# Patient Record
Sex: Female | Born: 1955 | Race: Asian | Hispanic: No | Marital: Married | Smoking: Never smoker
Health system: Southern US, Community
[De-identification: ages and names within clinical notes are randomized; demographics above are authoritative.]

## PROBLEM LIST (undated history)

## (undated) DIAGNOSIS — D649 Anemia, unspecified: Secondary | ICD-10-CM

## (undated) DIAGNOSIS — K219 Gastro-esophageal reflux disease without esophagitis: Secondary | ICD-10-CM

## (undated) DIAGNOSIS — E785 Hyperlipidemia, unspecified: Secondary | ICD-10-CM

## (undated) DIAGNOSIS — T7840XA Allergy, unspecified, initial encounter: Secondary | ICD-10-CM

## (undated) HISTORY — DX: Gastro-esophageal reflux disease without esophagitis: K21.9

## (undated) HISTORY — DX: Anemia, unspecified: D64.9

## (undated) HISTORY — PX: OVARIAN CYST REMOVAL: SHX89

## (undated) HISTORY — PX: ECTOPIC PREGNANCY SURGERY: SHX613

## (undated) HISTORY — DX: Allergy, unspecified, initial encounter: T78.40XA

## (undated) HISTORY — DX: Hyperlipidemia, unspecified: E78.5

## (undated) HISTORY — PX: APPENDECTOMY: SHX54

---

## 2018-04-22 DIAGNOSIS — M2669 Other specified disorders of temporomandibular joint: Secondary | ICD-10-CM | POA: Insufficient documentation

## 2020-03-08 ENCOUNTER — Encounter: Payer: Self-pay | Admitting: Medical-Surgical

## 2020-03-08 ENCOUNTER — Encounter: Payer: Self-pay | Admitting: Gastroenterology

## 2020-03-08 ENCOUNTER — Ambulatory Visit (INDEPENDENT_AMBULATORY_CARE_PROVIDER_SITE_OTHER): Payer: BC Managed Care – PPO | Admitting: Medical-Surgical

## 2020-03-08 VITALS — BP 110/72 | HR 64 | Temp 98.1°F | Ht 65.0 in | Wt 125.6 lb

## 2020-03-08 DIAGNOSIS — Z114 Encounter for screening for human immunodeficiency virus [HIV]: Secondary | ICD-10-CM

## 2020-03-08 DIAGNOSIS — Z0000 Encounter for general adult medical examination without abnormal findings: Secondary | ICD-10-CM | POA: Diagnosis not present

## 2020-03-08 DIAGNOSIS — Z1329 Encounter for screening for other suspected endocrine disorder: Secondary | ICD-10-CM

## 2020-03-08 DIAGNOSIS — Z7689 Persons encountering health services in other specified circumstances: Secondary | ICD-10-CM | POA: Diagnosis not present

## 2020-03-08 DIAGNOSIS — Z13820 Encounter for screening for osteoporosis: Secondary | ICD-10-CM

## 2020-03-08 DIAGNOSIS — Z1211 Encounter for screening for malignant neoplasm of colon: Secondary | ICD-10-CM

## 2020-03-08 DIAGNOSIS — Z1159 Encounter for screening for other viral diseases: Secondary | ICD-10-CM | POA: Diagnosis not present

## 2020-03-08 DIAGNOSIS — Z23 Encounter for immunization: Secondary | ICD-10-CM

## 2020-03-08 DIAGNOSIS — Z1231 Encounter for screening mammogram for malignant neoplasm of breast: Secondary | ICD-10-CM

## 2020-03-08 NOTE — Patient Instructions (Addendum)

## 2020-03-08 NOTE — Progress Notes (Signed)
New Patient Office Visit  Subjective:  Patient ID: Brittany Travis, female    DOB: 1955-07-16  Age: 64 y.o. MRN: [id number]  CC:  Chief Complaint  Patient presents with  . Establish Care    HPI Brittany Travis presents for to establish care and for an annual physical exam.  She has not had a healthcare provider in approximately 3 years and would like to get caught up on all of her health care needs today.   Dentist- not established with a dentist yet, using Invisalign Eye exam- last year, wears glasses Exercise- swimming  Diet- no special diet, allergic to several fresh fruits (apples, pears, peach, plum, berries, almond) that causes itchy throat but no hives/anaphylaxis; can eat baked/cooked fruits  Dizziness- history of vertigo x2.  Notes that she does have dizziness regularly with rapid position changes and or turning quickly.  This resolves very quickly but has begun to occur more rapidly in the recent year.  Not accompanied by nausea or hearing changes.  No falls or syncopal episodes.  History reviewed. No pertinent past medical history.  Past Surgical History:  Procedure Laterality Date  . APPENDECTOMY    . CESAREAN SECTION    . ECTOPIC PREGNANCY SURGERY    . OVARIAN CYST REMOVAL      Family History  Problem Relation Age of Onset  . Hypothyroidism Mother     Social History   Socioeconomic History  . Marital status: Married    Spouse name: Not on file  . Number of children: Not on file  . Years of education: Not on file  . Highest education level: Not on file  Occupational History  . Not on file  Tobacco Use  . Smoking status: Never Smoker  . Smokeless tobacco: Never Used  Vaping Use  . Vaping Use: Former  Substance and Sexual Activity  . Alcohol use: Yes    Comment: Rarely  . Drug use: Never  . Sexual activity: Yes    Partners: Male    Birth control/protection: Post-menopausal  Other Topics Concern  . Not on file  Social History Narrative  . Not on file    Social Determinants of Health   Financial Resource Strain:   . Difficulty of Paying Living Expenses: Not on file  Food Insecurity:   . Worried About Programme researcher, broadcasting/film/video in the Last Year: Not on file  . Ran Out of Food in the Last Year: Not on file  Transportation Needs:   . Lack of Transportation (Medical): Not on file  . Lack of Transportation (Non-Medical): Not on file  Physical Activity:   . Days of Exercise per Week: Not on file  . Minutes of Exercise per Session: Not on file  Stress:   . Feeling of Stress : Not on file  Social Connections:   . Frequency of Communication with Friends and Family: Not on file  . Frequency of Social Gatherings with Friends and Family: Not on file  . Attends Religious Services: Not on file  . Active Member of Clubs or Organizations: Not on file  . Attends Banker Meetings: Not on file  . Marital Status: Not on file  Intimate Partner Violence:   . Fear of Current or Ex-Partner: Not on file  . Emotionally Abused: Not on file  . Physically Abused: Not on file  . Sexually Abused: Not on file    ROS Review of Systems  Constitutional: Positive for unexpected weight change (5lbs; thinks it's related  to difficulty chewing). Negative for chills, fatigue and fever.  HENT: Negative for congestion, hearing loss, rhinorrhea, sinus pressure and sore throat.   Eyes: Negative for visual disturbance.  Respiratory: Negative for cough, chest tightness, shortness of breath and wheezing.   Cardiovascular: Positive for leg swelling (with prolonged sitting or standing). Negative for chest pain and palpitations.  Gastrointestinal: Positive for constipation (intermittently). Negative for abdominal pain, diarrhea, nausea and vomiting.  Endocrine: Negative for cold intolerance, heat intolerance, polydipsia, polyphagia and polyuria.  Genitourinary: Negative for dysuria, frequency and urgency.  Musculoskeletal: Negative for arthralgias, back pain and  myalgias.  Skin: Negative for pallor and rash.  Allergic/Immunologic: Positive for environmental allergies and food allergies.  Neurological: Positive for dizziness and light-headedness. Negative for weakness and headaches.  Hematological: Bruises/bleeds easily.  Psychiatric/Behavioral: Negative for dysphoric mood, self-injury, sleep disturbance and suicidal ideas. The patient is not nervous/anxious.     Objective:   Today's Vitals: BP 110/72   Pulse 64   Temp 98.1 F (36.7 C) (Oral)   Ht 5\' 5"  (1.651 m)   Wt 125 lb 9.6 oz (57 kg)   SpO2 98%   BMI 20.90 kg/m   Physical Exam Constitutional:      General: She is not in acute distress.    Appearance: Normal appearance. She is normal weight. She is not ill-appearing.  HENT:     Head: Normocephalic and atraumatic.     Right Ear: Tympanic membrane and external ear normal. There is no impacted cerumen.     Left Ear: Tympanic membrane, ear canal and external ear normal. There is no impacted cerumen.     Ears:     Comments: Mild right external ear canal erythema without exudate.    Nose: Nose normal.     Mouth/Throat:     Mouth: Mucous membranes are moist.     Pharynx: No oropharyngeal exudate or posterior oropharyngeal erythema.  Eyes:     Extraocular Movements: Extraocular movements intact.     Conjunctiva/sclera: Conjunctivae normal.     Pupils: Pupils are equal, round, and reactive to light.  Neck:     Thyroid: No thyromegaly.     Vascular: No carotid bruit or JVD.     Trachea: Trachea normal.  Cardiovascular:     Rate and Rhythm: Normal rate and regular rhythm.     Pulses: Normal pulses.     Heart sounds: Normal heart sounds. No murmur heard.  No friction rub. No gallop.   Pulmonary:     Effort: Pulmonary effort is normal. No respiratory distress.     Breath sounds: Normal breath sounds. No wheezing.  Abdominal:     General: Bowel sounds are normal. There is no distension.     Palpations: Abdomen is soft.      Tenderness: There is no abdominal tenderness. There is no guarding.  Musculoskeletal:        General: Normal range of motion.     Cervical back: Normal range of motion and neck supple.  Skin:    General: Skin is warm and dry.  Neurological:     Mental Status: She is alert and oriented to person, place, and time.     Cranial Nerves: No cranial nerve deficit.  Psychiatric:        Mood and Affect: Mood normal.        Behavior: Behavior normal.        Thought Content: Thought content normal.        Judgment: Judgment normal.  Assessment & Plan:   1. Encounter to establish care Reviewed available information and discussed health concerns with patient. Overall healthy without chronic conditions. We will complete her physical today with blood work. She is overdue for pap smear but she will schedule that for a different day. Recommend getting a local dentist and eye care.  2. Annual physical exam Checking CBC, CMP, and Lipid panel - CBC - COMPLETE METABOLIC PANEL WITH GFR - Lipid panel  3. Encounter for hepatitis C screening test for low risk patient Discussed screening recommendations with patient.  She is agreeable so we will add this to her blood work today. - Hepatitis C antibody  4. Encounter for screening for HIV Discussed screening recommendations with patient.  She is agreeable so we will add this to her blood work today. - HIV Antibody (routine testing w rflx)  5. Screening for endocrine disorder Family history of hypothyroidism in her mother and recent 5 pound weight loss.  Checking TSH today. - TSH  6. Encounter for screening mammogram for malignant neoplasm of breast Mammogram ordered. - MM 3D SCREEN BREAST BILATERAL; Future  7. Screen for colon cancer Referring to GI for colonoscopy. - Ambulatory referral to Gastroenterology  8. Screening for osteoporosis DEXA scan ordered. - DG Bone Density; Future  No outpatient encounter medications on file as of  03/08/2020.   No facility-administered encounter medications on file as of 03/08/2020.    Follow-up: Return in about 1 year (around 03/08/2021) for annual physical exam with labs. Return for pap smear at your convenience.  Thayer Ohm, DNP, APRN, FNP-BC  [facility]

## 2020-03-13 LAB — LIPID PANEL
Cholesterol: 197 mg/dL (ref <200)
HDL: 59 mg/dL (ref >=50)
LDL Cholesterol (Calc): 114 mg/dL (calc) — ABNORMAL HIGH
Non-HDL Cholesterol (Calc): 138 mg/dL (calc) — ABNORMAL HIGH (ref <130)
Total CHOL/HDL Ratio: 3.3 (calc) (ref <5.0)
Triglycerides: 126 mg/dL (ref <150)

## 2020-03-13 LAB — COMPLETE METABOLIC PANEL WITH GFR
AG Ratio: 1.5 (calc) (ref 1.0–2.5)
ALT: 7 U/L (ref 6–29)
AST: 16 U/L (ref 10–35)
Albumin: 4.3 g/dL (ref 3.6–5.1)
Alkaline phosphatase (APISO): 71 U/L (ref 37–153)
BUN: 21 mg/dL (ref 7–25)
CO2: 32 mmol/L (ref 20–32)
Calcium: 9.6 mg/dL (ref 8.6–10.4)
Chloride: 103 mmol/L (ref 98–110)
Creat: 0.78 mg/dL (ref 0.50–0.99)
GFR, Est African American: 94 mL/min/{1.73_m2} (ref >=60)
GFR, Est Non African American: 81 mL/min/{1.73_m2} (ref >=60)
Globulin: 2.9 g/dL (calc) (ref 1.9–3.7)
Glucose, Bld: 85 mg/dL (ref 65–99)
Potassium: 4.1 mmol/L (ref 3.5–5.3)
Sodium: 142 mmol/L (ref 135–146)
Total Bilirubin: 0.4 mg/dL (ref 0.2–1.2)
Total Protein: 7.2 g/dL (ref 6.1–8.1)

## 2020-03-13 LAB — CBC
HCT: 40.8 % (ref 35.0–45.0)
Hemoglobin: 13.7 g/dL (ref 11.7–15.5)
MCH: 30.9 pg (ref 27.0–33.0)
MCHC: 33.6 g/dL (ref 32.0–36.0)
MCV: 91.9 fL (ref 80.0–100.0)
MPV: 9.6 fL (ref 7.5–12.5)
Platelets: 210 10*3/uL (ref 140–400)
RBC: 4.44 10*6/uL (ref 3.80–5.10)
RDW: 12.7 % (ref 11.0–15.0)
WBC: 4 10*3/uL (ref 3.8–10.8)

## 2020-03-13 LAB — TSH: TSH: 1.05 mIU/L (ref 0.40–4.50)

## 2020-03-13 LAB — HEPATITIS C ANTIBODY
Hepatitis C Ab: NONREACTIVE
SIGNAL TO CUT-OFF: 0.01 (ref <1.00)

## 2020-03-13 LAB — HIV ANTIBODY (ROUTINE TESTING W REFLEX): HIV 1&2 Ab, 4th Generation: NONREACTIVE

## 2020-03-22 NOTE — Progress Notes (Signed)
Subjective:    CC: pap smear  HPI: Pleasant 64 year old female presenting to have completion of her pap smear. Completed her physical a couple of weeks ago. Last pap smear at least 5 years ago. No history of abnormal pap smears. Denies vaginal discharge, itching, burning, dysuria, fever, chills, abdominal pain, and concern for STIs. Sexually active in a monogamous relationship with her husband.  I reviewed the past medical history, family history, social history, surgical history, and allergies today and no changes were needed.  Please see the problem list section below in epic for further details.  Past Medical History: History reviewed. No pertinent past medical history. Past Surgical History: Past Surgical History:  Procedure Laterality Date   APPENDECTOMY     CESAREAN SECTION     ECTOPIC PREGNANCY SURGERY     OVARIAN CYST REMOVAL     Social History: Social History   Socioeconomic History   Marital status: Married    Spouse name: Not on file   Number of children: Not on file   Years of education: Not on file   Highest education level: Not on file  Occupational History   Not on file  Tobacco Use   Smoking status: Never Smoker   Smokeless tobacco: Never Used  Vaping Use   Vaping Use: Former  Substance and Sexual Activity   Alcohol use: Yes    Comment: Rarely   Drug use: Never   Sexual activity: Yes    Partners: Male    Birth control/protection: Post-menopausal  Other Topics Concern   Not on file  Social History Narrative   Not on file   Social Determinants of Health   Financial Resource Strain:    Difficulty of Paying Living Expenses: Not on file  Food Insecurity:    Worried About Programme researcher, broadcasting/film/video in the Last Year: Not on file   [employer] of Food in the Last Year: Not on file  Transportation Needs:    Lack of Transportation (Medical): Not on file   Lack of Transportation (Non-Medical): Not on file  Physical Activity:    Days of  Exercise per Week: Not on file   Minutes of Exercise per Session: Not on file  Stress:    Feeling of Stress : Not on file  Social Connections:    Frequency of Communication with Friends and Family: Not on file   Frequency of Social Gatherings with Friends and Family: Not on file   Attends Religious Services: Not on file   Active Member of Clubs or Organizations: Not on file   Attends Banker Meetings: Not on file   Marital Status: Not on file   Family History: Family History  Problem Relation Age of Onset   Hypothyroidism Mother    Allergies: No Known Allergies Medications: See med rec.  Review of Systems: See HPI for pertinent positives and negatives.  Objective:    General: Well Developed, well nourished, and in no acute distress.  Neuro: Alert and oriented x3.  HEENT: Normocephalic, atraumatic.  Skin: Warm and dry. Cardiac: Regular rate and rhythm, no murmurs rubs or gallops, no lower extremity edema.  Respiratory: Clear to auscultation bilaterally. Not using accessory muscles, speaking in full sentences. Pelvic exam: normal external genitalia, vulva, vagina, cervix, uterus and adnexa, PAP: Pap smear done today, HPV test, exam chaperoned by Ihor Gully, MA.   Impression and Recommendations:    1. Cervical cancer screening Pap smear with HPV co-testing completed today. If negative, no further pap smears  will be indicated unless symptoms develop. - Cytology - PAP  Return if symptoms worsen or fail to improve. ___________________________________________ Thayer Ohm, DNP, APRN, FNP-BC [facility] and [facility]

## 2020-03-23 ENCOUNTER — Ambulatory Visit (INDEPENDENT_AMBULATORY_CARE_PROVIDER_SITE_OTHER): Payer: BC Managed Care – PPO | Admitting: Medical-Surgical

## 2020-03-23 ENCOUNTER — Encounter: Payer: Self-pay | Admitting: Medical-Surgical

## 2020-03-23 ENCOUNTER — Other Ambulatory Visit: Payer: Self-pay

## 2020-03-23 ENCOUNTER — Other Ambulatory Visit (HOSPITAL_COMMUNITY)
Admission: RE | Admit: 2020-03-23 | Discharge: 2020-03-23 | Disposition: A | Discharge status: Home / Self Care | Payer: BC Managed Care – PPO | Source: Ambulatory Visit | Attending: Medical-Surgical | Admitting: Medical-Surgical

## 2020-03-23 VITALS — BP 100/62 | HR 68 | Temp 98.1°F | Ht 65.0 in | Wt 128.5 lb

## 2020-03-23 DIAGNOSIS — Z124 Encounter for screening for malignant neoplasm of cervix: Secondary | ICD-10-CM | POA: Diagnosis not present

## 2020-03-27 LAB — CYTOLOGY - PAP
Comment: NEGATIVE
Diagnosis: NEGATIVE
High risk HPV: NEGATIVE

## 2020-04-25 ENCOUNTER — Other Ambulatory Visit: Payer: Self-pay

## 2020-04-25 ENCOUNTER — Ambulatory Visit (INDEPENDENT_AMBULATORY_CARE_PROVIDER_SITE_OTHER): Payer: BC Managed Care – PPO

## 2020-04-25 DIAGNOSIS — Z13820 Encounter for screening for osteoporosis: Secondary | ICD-10-CM | POA: Diagnosis not present

## 2020-04-25 DIAGNOSIS — Z1231 Encounter for screening mammogram for malignant neoplasm of breast: Secondary | ICD-10-CM | POA: Diagnosis not present

## 2020-04-25 DIAGNOSIS — Z0000 Encounter for general adult medical examination without abnormal findings: Secondary | ICD-10-CM

## 2020-04-26 ENCOUNTER — Ambulatory Visit (AMBULATORY_SURGERY_CENTER): Payer: Self-pay | Admitting: *Deleted

## 2020-04-26 ENCOUNTER — Other Ambulatory Visit: Payer: Self-pay

## 2020-04-26 VITALS — Ht 65.0 in | Wt 126.0 lb

## 2020-04-26 DIAGNOSIS — Z1211 Encounter for screening for malignant neoplasm of colon: Secondary | ICD-10-CM

## 2020-04-26 MED ORDER — SUTAB 1479-225-188 MG PO TABS: 24.0000 | ORAL_TABLET | ORAL | 0 refills | Status: DC

## 2020-04-26 NOTE — Progress Notes (Signed)

## 2020-04-27 ENCOUNTER — Encounter: Payer: Self-pay | Admitting: Gastroenterology

## 2020-05-10 ENCOUNTER — Other Ambulatory Visit: Payer: Self-pay

## 2020-05-10 ENCOUNTER — Ambulatory Visit (AMBULATORY_SURGERY_CENTER): Payer: BC Managed Care – PPO | Admitting: Gastroenterology

## 2020-05-10 ENCOUNTER — Encounter: Payer: Self-pay | Admitting: Gastroenterology

## 2020-05-10 VITALS — BP 104/58 | HR 70 | Temp 98.6°F | Resp 8 | Ht 65.0 in | Wt 126.0 lb

## 2020-05-10 DIAGNOSIS — Z1211 Encounter for screening for malignant neoplasm of colon: Secondary | ICD-10-CM | POA: Diagnosis not present

## 2020-05-10 DIAGNOSIS — K5730 Diverticulosis of large intestine without perforation or abscess without bleeding: Secondary | ICD-10-CM

## 2020-05-10 DIAGNOSIS — K641 Second degree hemorrhoids: Secondary | ICD-10-CM

## 2020-05-10 MED ORDER — SODIUM CHLORIDE 0.9 % IV SOLN: 500.0000 mL | INTRAVENOUS | Status: DC

## 2020-05-10 NOTE — Progress Notes (Signed)
Pt Drowsy. VSS. To PACU, report to RN. No anesthetic complications noted.  

## 2020-05-10 NOTE — Op Note (Signed)
[facility] Patient Name: Brittany MesaRuby Curnow Procedure Date: 05/10/2020 10:09 AM MRN: [id number] Endoscopist: Doristine LocksVito Jacquita Mulhearn , MD Age: 64 Referring MD:  Date of Birth: Nov 19, 1955 Gender: Female Account #: [id number] Procedure:                Colonoscopy Indications:              Screening for colorectal malignant neoplasm (last                            colonoscopy was more than 10 years ago)                           [place] colon[place] 10+ years [place] in [place] and notable                            for hemorrhoids, but otherwise no polyps. She is                            otherwise without GI symptoms. Medicines:                Monitored Anesthesia Care Procedure:                Pre-Anesthesia Assessment:                           - Prior to the procedure, a History and Physical                            was performed, and patient medications and                            allergies were reviewed. The patient's tolerance of                            previous anesthesia was also reviewed. The risks                            and benefits of the procedure and the sedation                            options and risks were discussed with the patient.                            All questions were answered, and informed consent                            was obtained. Prior Anticoagulants: The patient has                            taken no previous anticoagulant or antiplatelet                            agents. ASA Grade Assessment: II - A patient with  mild systemic disease. After reviewing the risks                            and benefits, the patient was deemed in                            satisfactory condition to undergo the procedure.                           After obtaining informed consent, the colonoscope                            was passed under direct vision. Throughout the                            procedure, the patient's blood pressure, pulse,  and                            oxygen saturations were monitored continuously. The                            Colonoscope was introduced through the anus and                            advanced to the the terminal ileum. The colonoscopy                            was performed without difficulty. The patient                            tolerated the procedure well. The quality of the                            bowel preparation was good. The terminal ileum,                            ileocecal valve, appendiceal orifice, and rectum                            were photographed. Scope In: 10:14:23 AM Scope Out: 10:25:27 AM Scope Withdrawal Time: 0 hours 8 minutes 2 seconds  Total Procedure Duration: 0 hours 11 minutes 4 seconds  Findings:                 Hemorrhoids were found on perianal exam.                           A single small-mouthed diverticulum was found in                            the ascending colon.                           The exam was otherwise normal throughout the  remainder of the colon.                           Non-bleeding internal hemorrhoids were found during                            retroflexion. The hemorrhoids were small and Grade                            II (internal hemorrhoids that prolapse but reduce                            spontaneously).                           The terminal ileum appeared normal. Complications:            No immediate complications. Estimated Blood Loss:     Estimated blood loss: none. Impression:               - Hemorrhoids found on perianal exam.                           - Diverticulosis in the ascending colon.                           - Non-bleeding internal hemorrhoids.                           - The examined portion of the ileum was normal.                           - No specimens collected. Recommendation:           - Patient has a contact number available for                             emergencies. The signs and symptoms of potential                            delayed complications were discussed with the                            patient. Return to normal activities tomorrow.                            Written discharge instructions were provided to the                            patient.                           - Resume previous diet.                           - Continue present medications.                           -  Repeat colonoscopy in 10 years for screening                            purposes.                           - Return to GI office PRN.                           - Internal hemorrhoids were noted on this study and                            may be amenable to hemorrhoid band ligation. If you                            are interested in further treatment of these                            hemorrhoids with band ligation, please contact my                            clinic to set up an appointment for evaluation and                            treatment. Doristine Locks, MD 05/10/2020 10:29:49 AM

## 2020-05-10 NOTE — Progress Notes (Signed)
Vs CW I have reviewed the patient's medical history in detail and updated the computerized patient record.   

## 2020-05-10 NOTE — Patient Instructions (Signed)
YOU HAD AN ENDOSCOPIC PROCEDURE TODAY AT THE [facility]:   Refer to the procedure report that was given to you for any specific questions about what was found during the examination.  If the procedure report does not answer your questions, please call your gastroenterologist to clarify.  If you requested that your care partner not be given the details of your procedure findings, then the procedure report has been included in a sealed envelope for you to review at your convenience later.  YOU SHOULD EXPECT: Some feelings of bloating in the abdomen. Passage of more gas than usual.  Walking can help get rid of the air that was put into your GI tract during the procedure and reduce the bloating. If you had a lower endoscopy (such as a colonoscopy or flexible sigmoidoscopy) you may notice spotting of blood in your stool or on the toilet paper. If you underwent a bowel prep for your procedure, you may not have a normal bowel movement for a few days.  Please Note:  You might notice some irritation and congestion in your nose or some drainage.  This is from the oxygen used during your procedure.  There is no need for concern and it should clear up in a day or so.  SYMPTOMS TO REPORT IMMEDIATELY:   Following lower endoscopy (colonoscopy or flexible sigmoidoscopy):  Excessive amounts of blood in the stool  Significant tenderness or worsening of abdominal pains  Swelling of the abdomen that is new, acute  Fever of 100F or higher  For urgent or emergent issues, a gastroenterologist can be reached at any hour by calling [phone]. Do not use MyChart messaging for urgent concerns.    DIET:  We do recommend a small meal at first, but then you may proceed to your regular diet.  Drink plenty of fluids but you should avoid alcoholic beverages for 24 hours.  ACTIVITY:  You should plan to take it easy for the rest of today and you should NOT DRIVE or use heavy machinery until tomorrow (because  of the sedation medicines used during the test).    FOLLOW UP: Our staff will call the number listed on your records 48-72 hours following your procedure to check on you and address any questions or concerns that you may have regarding the information given to you following your procedure. If we do not reach you, we will leave a message.  We will attempt to reach you two times.  During this call, we will ask if you have developed any symptoms of COVID 19. If you develop any symptoms (ie: fever, flu-like symptoms, shortness of breath, cough etc.) before then, please call [phone].  If you test positive for Covid 19 in the 2 weeks post procedure, please call and report this information to us.    If any biopsies were taken you will be contacted by phone or by letter within the next 1-3 weeks.  Please call us at [phone] if you have not heard about the biopsies in 3 weeks.    SIGNATURES/CONFIDENTIALITY: You and/or your care partner have signed paperwork which will be entered into your electronic medical record.  These signatures attest to the fact that that the information above on your After Visit Summary has been reviewed and is understood.  Full responsibility of the confidentiality of this discharge information lies with you and/or your care-partner. 

## 2020-05-14 ENCOUNTER — Telehealth: Payer: Self-pay | Admitting: *Deleted

## 2020-05-14 NOTE — Telephone Encounter (Signed)
Attempted 2nd f/u phone call. No answer. Left message.  °

## 2020-05-14 NOTE — Telephone Encounter (Signed)
Attempted f/u phone call. No answer. Left message. °

## 2020-06-26 ENCOUNTER — Encounter: Payer: BC Managed Care – PPO | Admitting: Gastroenterology

## 2021-03-22 ENCOUNTER — Encounter: Payer: Self-pay | Admitting: Medical-Surgical

## 2021-03-22 ENCOUNTER — Ambulatory Visit (INDEPENDENT_AMBULATORY_CARE_PROVIDER_SITE_OTHER): Payer: BC Managed Care – PPO | Admitting: Medical-Surgical

## 2021-03-22 ENCOUNTER — Other Ambulatory Visit: Payer: Self-pay

## 2021-03-22 VITALS — BP 101/65 | HR 64 | Resp 20 | Ht 65.0 in | Wt 128.3 lb

## 2021-03-22 DIAGNOSIS — Z0000 Encounter for general adult medical examination without abnormal findings: Secondary | ICD-10-CM | POA: Diagnosis not present

## 2021-03-22 DIAGNOSIS — L299 Pruritus, unspecified: Secondary | ICD-10-CM

## 2021-03-22 DIAGNOSIS — Z23 Encounter for immunization: Secondary | ICD-10-CM

## 2021-03-22 DIAGNOSIS — Z1231 Encounter for screening mammogram for malignant neoplasm of breast: Secondary | ICD-10-CM

## 2021-03-22 LAB — CBC WITH DIFFERENTIAL/PLATELET
Absolute Monocytes: 193 cells/uL — ABNORMAL LOW (ref 200–950)
Basophils Absolute: 19 cells/uL (ref 0–200)
Basophils Relative: 0.4 %
Eosinophils Absolute: 160 cells/uL (ref 15–500)
Eosinophils Relative: 3.4 %
HCT: 42.8 % (ref 35.0–45.0)
Hemoglobin: 14.1 g/dL (ref 11.7–15.5)
Lymphs Abs: 1711 cells/uL (ref 850–3900)
MCH: 30.1 pg (ref 27.0–33.0)
MCHC: 32.9 g/dL (ref 32.0–36.0)
MCV: 91.5 fL (ref 80.0–100.0)
MPV: 9.7 fL (ref 7.5–12.5)
Monocytes Relative: 4.1 %
Neutro Abs: 2618 cells/uL (ref 1500–7800)
Neutrophils Relative %: 55.7 %
Platelets: 205 10*3/uL (ref 140–400)
RBC: 4.68 10*6/uL (ref 3.80–5.10)
RDW: 13 % (ref 11.0–15.0)
Total Lymphocyte: 36.4 %
WBC: 4.7 10*3/uL (ref 3.8–10.8)

## 2021-03-22 LAB — LIPID PANEL
Cholesterol: 215 mg/dL — ABNORMAL HIGH (ref <200)
HDL: 63 mg/dL (ref >=50)
LDL Cholesterol (Calc): 126 mg/dL (calc) — ABNORMAL HIGH
Non-HDL Cholesterol (Calc): 152 mg/dL (calc) — ABNORMAL HIGH (ref <130)
Total CHOL/HDL Ratio: 3.4 (calc) (ref <5.0)
Triglycerides: 143 mg/dL (ref <150)

## 2021-03-22 LAB — COMPLETE METABOLIC PANEL WITH GFR
AG Ratio: 1.6 (calc) (ref 1.0–2.5)
ALT: 11 U/L (ref 6–29)
AST: 19 U/L (ref 10–35)
Albumin: 4.5 g/dL (ref 3.6–5.1)
Alkaline phosphatase (APISO): 84 U/L (ref 37–153)
BUN: 17 mg/dL (ref 7–25)
CO2: 32 mmol/L (ref 20–32)
Calcium: 9.6 mg/dL (ref 8.6–10.4)
Chloride: 102 mmol/L (ref 98–110)
Creat: 0.81 mg/dL (ref 0.50–1.05)
Globulin: 2.8 g/dL (calc) (ref 1.9–3.7)
Glucose, Bld: 89 mg/dL (ref 65–99)
Potassium: 4.4 mmol/L (ref 3.5–5.3)
Sodium: 141 mmol/L (ref 135–146)
Total Bilirubin: 0.7 mg/dL (ref 0.2–1.2)
Total Protein: 7.3 g/dL (ref 6.1–8.1)
eGFR: 81 mL/min/{1.73_m2} (ref >=60)

## 2021-03-22 MED ORDER — HYDROCORTISONE-ACETIC ACID 1-2 % OT SOLN: 3.0000 [drp] | Freq: Two times a day (BID) | OTIC | 0 refills | Status: DC | PRN

## 2021-03-22 NOTE — Patient Instructions (Signed)
Preventive Care 40-64 Years Old, Female Preventive care refers to lifestyle choices and visits with your health care provider that can promote health and wellness. This includes: A yearly physical exam. This is also called an annual wellness visit. Regular dental and eye exams. Immunizations. Screening for certain conditions. Healthy lifestyle choices, such as: Eating a healthy diet. Getting regular exercise. Not using drugs or products that contain nicotine and tobacco. Limiting alcohol use. What can I expect for my preventive care visit? Physical exam Your health care provider will check your: Height and weight. These may be used to calculate your BMI (body mass index). BMI is a measurement that tells if you are at a healthy weight. Heart rate and blood pressure. Body temperature. Skin for abnormal spots. Counseling Your health care provider may ask you questions about your: Past medical problems. Family's medical history. Alcohol, tobacco, and drug use. Emotional well-being. Home life and relationship well-being. Sexual activity. Diet, exercise, and sleep habits. Work and work environment. Access to firearms. Method of birth control. Menstrual cycle. Pregnancy history. What immunizations do I need? Vaccines are usually given at various ages, according to a schedule. Your health care provider will recommend vaccines for you based on your age, medical history, and lifestyle or other factors, such as travel or where you work. What tests do I need? Blood tests Lipid and cholesterol levels. These may be checked every 5 years, or more often if you are over 50 years old. Hepatitis C test. Hepatitis B test. Screening Lung cancer screening. You may have this screening every year starting at age 55 if you have a 30-pack-year history of smoking and currently smoke or have quit within the past 15 years. Colorectal cancer screening. All adults should have this screening starting at  age 50 and continuing until age 75. Your health care provider may recommend screening at age 45 if you are at increased risk. You will have tests every 1-10 years, depending on your results and the type of screening test. Diabetes screening. This is done by checking your blood sugar (glucose) after you have not eaten for a while (fasting). You may have this done every 1-3 years. Mammogram. This may be done every 1-2 years. Talk with your health care provider about when you should start having regular mammograms. This may depend on whether you have a family history of breast cancer. BRCA-related cancer screening. This may be done if you have a family history of breast, ovarian, tubal, or peritoneal cancers. Pelvic exam and Pap test. This may be done every 3 years starting at age 21. Starting at age 30, this may be done every 5 years if you have a Pap test in combination with an HPV test. Other tests STD (sexually transmitted disease) testing, if you are at risk. Bone density scan. This is done to screen for osteoporosis. You may have this scan if you are at high risk for osteoporosis. Talk with your health care provider about your test results, treatment options, and if necessary, the need for more tests. Follow these instructions at home: Eating and drinking  Eat a diet that includes fresh fruits and vegetables, whole grains, lean protein, and low-fat dairy products. Take vitamin and mineral supplements as recommended by your health care provider. Do not drink alcohol if: Your health care provider tells you not to drink. You are pregnant, may be pregnant, or are planning to become pregnant. If you drink alcohol: Limit how much you have to 0-1 drink a day. Be   aware of how much alcohol is in your drink. In the [place] one drink equals one 12 oz bottle of beer (355 mL), one 5 oz glass of wine (148 mL), or one 1 oz glass of hard liquor (44 mL). Lifestyle Take daily care of your teeth and  gums. Brush your teeth every morning and night with fluoride toothpaste. Floss one time each day. Stay active. Exercise for at least 30 minutes 5 or more days each week. Do not use any products that contain nicotine or tobacco, such as cigarettes, e-cigarettes, and chewing tobacco. If you need help quitting, ask your health care provider. Do not use drugs. If you are sexually active, practice safe sex. Use a condom or other form of protection to prevent STIs (sexually transmitted infections). If you do not wish to become pregnant, use a form of birth control. If you plan to become pregnant, see your health care provider for a prepregnancy visit. If told by your health care provider, take low-dose aspirin daily starting at age 63. Find healthy ways to cope with stress, such as: Meditation, yoga, or listening to music. Journaling. Talking to a trusted person. Spending time with friends and family. Safety Always wear your seat belt while driving or riding in a vehicle. Do not drive: If you have been drinking alcohol. Do not ride with someone who has been drinking. When you are tired or distracted. While texting. Wear a helmet and other protective equipment during sports activities. If you have firearms in your house, make sure you follow all gun safety procedures. What's next? Visit your health care provider once a year for an annual wellness visit. Ask your health care provider how often you should have your eyes and teeth checked. Stay up to date on all vaccines. This information is not intended to replace advice given to you by your health care provider. Make sure you discuss any questions you have with your health care provider. Document Revised: 08/31/2020 Document Reviewed: 03/04/2018 Elsevier Patient Education  2022 [employer].

## 2021-03-22 NOTE — Progress Notes (Signed)
HPI: Brittany Travis is a 65 y.o. female who  has a past medical history of Allergy, Anemia, GERD (gastroesophageal reflux disease), and Hyperlipidemia.  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 04/22/2018    Past Surgical History:  Procedure Laterality Date   APPENDECTOMY     CESAREAN SECTION     ECTOPIC PREGNANCY SURGERY     OVARIAN CYST REMOVAL      Social History   Tobacco Use   Smoking status: Never   Smokeless tobacco: Never  Substance Use Topics   Alcohol use: Yes    Comment: Rarely    Family History  Problem Relation Age of Onset   Hypothyroidism Mother    Colon cancer Neg Hx    Colon polyps Neg Hx    Esophageal cancer Neg Hx    Rectal cancer Neg Hx    Stomach cancer Neg Hx      Current medication list and allergy/intolerance information reviewed:    Current Outpatient Medications  Medication Sig Dispense Refill   Multiple Vitamin (MULTIVITAMIN) tablet Take 1 tablet by mouth daily.     No current facility-administered medications for this visit.    No Known Allergies    Review of Systems: Constitutional:  No  fever, no chills, No recent illness, No unintentional weight changes. No significant fatigue.  HEENT: No  headache, no vision change, no hearing change, No sore throat, No  sinus pressure, + mild intermittent ear itching Cardiac: No  chest pain, No  pressure, No palpitations, No  Orthopnea Respiratory:  No  shortness of breath. No  Cough Gastrointestinal: No  abdominal pain, No  nausea, No  vomiting,  No  blood in stool, No   diarrhea, No  constipation  Musculoskeletal: No new myalgia/arthralgia Skin: No  Rash, No other wounds/concerning lesions Genitourinary: No  incontinence, No  abnormal genital bleeding, No abnormal genital discharge Hem/Onc: No  easy bruising/bleeding, No  abnormal lymph node Endocrine: No cold intolerance,  No heat intolerance. No polyuria/polydipsia/polyphagia  Neurologic: No  weakness, No  dizziness, No  slurred speech/focal weakness/facial droop Psychiatric: No  concerns with depression, No  concerns with anxiety, No sleep problems, No mood problems  Exam:  BP 101/65 (BP Location: Left Arm, Patient Position: Sitting, Cuff Size: Small)   Pulse 64   Resp 20   Ht $R'5\' 5"'Nn$  (1.651 m)   Wt 128 lb 4.8 oz (58.2 kg)   SpO2 95%   BMI 21.35 kg/m  Constitutional: VS see above. General Appearance: alert, well-developed, well-nourished, NAD Eyes: Normal lids and conjunctive, non-icteric sclera Ears, Nose, Mouth, Throat: MMM, Normal external inspection ears/nares/mouth/lips/gums. TM normal bilaterally.   Neck: No masses, trachea midline. No thyroid enlargement. No tenderness/mass appreciated. No lymphadenopathy Respiratory: Normal respiratory effort. no wheeze, no rhonchi, no rales Cardiovascular: S1/S2 normal, no murmur, no rub/gallop auscultated. RRR. No lower extremity edema. No carotid bruit or JVD. No abdominal aortic bruit. Gastrointestinal: Nontender, no masses. No hepatomegaly, no splenomegaly. No hernia appreciated. Bowel sounds normal. Rectal exam deferred.  Musculoskeletal: Gait normal. No clubbing/cyanosis of digits.  Neurological: Normal balance/coordination. No tremor. No cranial nerve  deficit on limited exam. Motor and sensation intact and symmetric. Cerebellar reflexes intact.  Skin: warm, dry, intact. No rash/ulcer. No concerning nevi or subq nodules on limited exam.   Psychiatric: Normal judgment/insight. Normal mood and affect. Oriented x3.    ASSESSMENT/PLAN:   1. Annual  physical exam Checking labs. Preventative care information provided with AVS - CBC with Differential/Platelet - COMPLETE METABOLIC PANEL WITH GFR - Lipid panel  2. Encounter for screening mammogram for malignant neoplasm of breast Mammogram ordered.  This will be scheduled at the end of October. - MM 3D SCREEN BREAST BILATERAL; Future  3. Need for shingles vaccine Shingles vaccine #1 given today.  Next vaccine will be due in 2 to 6 months as a nurse visit. - Varicella-zoster vaccine IM (Shingrix)  4.  External ear itching Suspect this is related to allergies versus some mild eczema.  Sending in hydrocortisone eardrops to use as needed to see if this helps.  Orders Placed This Encounter  Procedures   MM 3D SCREEN BREAST BILATERAL   Varicella-zoster vaccine IM (Shingrix)   CBC with Differential/Platelet   COMPLETE METABOLIC PANEL WITH GFR   Lipid panel    No orders of the defined types were placed in this encounter.   Patient Instructions  Preventive Care 79-73 Years Old, Female Preventive care refers to lifestyle choices and visits with your health care provider that can promote health and wellness. This includes: A yearly physical exam. This is also called an annual wellness visit. Regular dental and eye exams. Immunizations. Screening for certain conditions. Healthy lifestyle choices, such as: Eating a healthy diet. Getting regular exercise. Not using drugs or products that contain nicotine and tobacco. Limiting alcohol use. What can I expect for my preventive care visit? Physical exam Your health care provider will check your: Height and weight. These may be used to calculate your BMI (body mass index). BMI is a measurement that tells if you are at a healthy weight. Heart rate and blood pressure. Body temperature. Skin for abnormal spots. Counseling Your health care provider may ask you questions about your: Past medical problems. Family's medical  history. Alcohol, tobacco, and drug use. Emotional well-being. Home life and relationship well-being. Sexual activity. Diet, exercise, and sleep habits. Work and work Statistician. Access to firearms. Method of birth control. Menstrual cycle. Pregnancy history. What immunizations do I need? Vaccines are usually given at various ages, according to a schedule. Your health care provider will recommend vaccines for you based on your age, medical history, and lifestyle or other factors, such as travel or where you work. What tests do I need? Blood tests Lipid and cholesterol levels. These may be checked every 5 years, or more often if you are over 55 years old. Hepatitis C test. Hepatitis B test. Screening Lung cancer screening. You may have this screening every year starting at age 68 if you have a 30-pack-year history of smoking and currently smoke or have quit within the past 15 years. Colorectal cancer screening. All adults should have this screening starting at age 81 and continuing until age 80. Your health care provider may recommend screening at age 71 if you are at increased risk. You will have tests every 1-10 years, depending on your results and the type of screening test. Diabetes screening. This is done by checking your blood sugar (glucose) after you have not eaten for a while (fasting). You may have this done every 1-3 years. Mammogram. This may be done every 1-2 years. Talk with  your health care provider about when you should start having regular mammograms. This may depend on whether you have a family history of breast cancer. BRCA-related cancer screening. This may be done if you have a family history of breast, ovarian, tubal, or peritoneal cancers. Pelvic exam and Pap test. This may be done every 3 years starting at age 64. Starting at age 22, this may be done every 5 years if you have a Pap test in combination with an HPV test. Other tests STD (sexually transmitted  disease) testing, if you are at risk. Bone density scan. This is done to screen for osteoporosis. You may have this scan if you are at high risk for osteoporosis. Talk with your health care provider about your test results, treatment options, and if necessary, the need for more tests. Follow these instructions at home: Eating and drinking  Eat a diet that includes fresh fruits and vegetables, whole grains, lean protein, and low-fat dairy products. Take vitamin and mineral supplements as recommended by your health care provider. Do not drink alcohol if: Your health care provider tells you not to drink. You are pregnant, may be pregnant, or are planning to become pregnant. If you drink alcohol: Limit how much you have to 0-1 drink a day. Be aware of how much alcohol is in your drink. In the [place] one drink equals one 12 oz bottle of beer (355 mL), one 5 oz glass of wine (148 mL), or one 1 oz glass of hard liquor (44 mL). Lifestyle Take daily care of your teeth and gums. Brush your teeth every morning and night with fluoride toothpaste. Floss one time each day. Stay active. Exercise for at least 30 minutes 5 or more days each week. Do not use any products that contain nicotine or tobacco, such as cigarettes, e-cigarettes, and chewing tobacco. If you need help quitting, ask your health care provider. Do not use drugs. If you are sexually active, practice safe sex. Use a condom or other form of protection to prevent STIs (sexually transmitted infections). If you do not wish to become pregnant, use a form of birth control. If you plan to become pregnant, see your health care provider for a prepregnancy visit. If told by your health care provider, take low-dose aspirin daily starting at age 69. Find healthy ways to cope with stress, such as: Meditation, yoga, or listening to music. Journaling. Talking to a trusted person. Spending time with friends and family. Safety Always wear your seat belt  while driving or riding in a vehicle. Do not drive: If you have been drinking alcohol. Do not ride with someone who has been drinking. When you are tired or distracted. While texting. Wear a helmet and other protective equipment during sports activities. If you have firearms in your house, make sure you follow all gun safety procedures. What's next? Visit your health care provider once a year for an annual wellness visit. Ask your health care provider how often you should have your eyes and teeth checked. Stay up to date on all vaccines. This information is not intended to replace advice given to you by your health care provider. Make sure you discuss any questions you have with your health care provider. Document Revised: 08/31/2020 Document Reviewed: 03/04/2018 Elsevier Patient Education  [place].  Follow-up plan: Return in about 1 year (around 03/22/2022) for annual physical exam; 2-6 months for NV for second Shingrix.  Clearnce Sorrel, DNP, APRN, FNP-BC [facility]

## 2021-05-01 ENCOUNTER — Ambulatory Visit: Payer: BC Managed Care – PPO

## 2021-05-15 ENCOUNTER — Ambulatory Visit (INDEPENDENT_AMBULATORY_CARE_PROVIDER_SITE_OTHER): Payer: BC Managed Care – PPO

## 2021-05-15 ENCOUNTER — Other Ambulatory Visit: Payer: Self-pay

## 2021-05-15 DIAGNOSIS — Z1231 Encounter for screening mammogram for malignant neoplasm of breast: Secondary | ICD-10-CM | POA: Diagnosis not present

## 2021-08-21 ENCOUNTER — Ambulatory Visit (INDEPENDENT_AMBULATORY_CARE_PROVIDER_SITE_OTHER): Payer: BC Managed Care – PPO | Admitting: Medical-Surgical

## 2021-08-21 ENCOUNTER — Other Ambulatory Visit: Payer: Self-pay

## 2021-08-21 VITALS — Temp 98.0°F

## 2021-08-21 DIAGNOSIS — Z23 Encounter for immunization: Secondary | ICD-10-CM

## 2021-08-21 NOTE — Progress Notes (Signed)
Patient is here for a shingles vaccine. Denies chest pains, SOB, palpitations, mood or medication changes.   Patient tolerated injection well without complications on the Left deltoid. Patient advised to schedule next appointment as requested from provider.

## 2021-08-21 NOTE — Progress Notes (Signed)
Agree with documentation as above.  ? ?___________________________________________ ?Syrina Wake L. Ayleah Hofmeister, DNP, APRN, FNP-BC ?Primary Care and Sports Medicine ?[facility] ? ?

## 2021-09-18 ENCOUNTER — Encounter: Payer: Self-pay | Admitting: Medical-Surgical

## 2021-09-21 ENCOUNTER — Encounter: Payer: Self-pay | Admitting: Medical-Surgical

## 2021-09-23 ENCOUNTER — Emergency Department (HOSPITAL_BASED_OUTPATIENT_CLINIC_OR_DEPARTMENT_OTHER): Payer: BC Managed Care – PPO

## 2021-09-23 ENCOUNTER — Observation Stay (HOSPITAL_BASED_OUTPATIENT_CLINIC_OR_DEPARTMENT_OTHER)
Admission: EM | Admit: 2021-09-23 | Discharge: 2021-09-25 | Disposition: A | Discharge status: Home / Self Care | Payer: BC Managed Care – PPO | Attending: Internal Medicine | Admitting: Internal Medicine

## 2021-09-23 ENCOUNTER — Telehealth: Payer: Self-pay | Admitting: General Practice

## 2021-09-23 ENCOUNTER — Other Ambulatory Visit: Payer: Self-pay

## 2021-09-23 ENCOUNTER — Encounter (HOSPITAL_BASED_OUTPATIENT_CLINIC_OR_DEPARTMENT_OTHER): Payer: Self-pay | Admitting: Emergency Medicine

## 2021-09-23 DIAGNOSIS — R079 Chest pain, unspecified: Secondary | ICD-10-CM | POA: Diagnosis present

## 2021-09-23 DIAGNOSIS — R0789 Other chest pain: Principal | ICD-10-CM | POA: Insufficient documentation

## 2021-09-23 DIAGNOSIS — R0602 Shortness of breath: Secondary | ICD-10-CM | POA: Diagnosis not present

## 2021-09-23 DIAGNOSIS — Z20822 Contact with and (suspected) exposure to covid-19: Secondary | ICD-10-CM | POA: Insufficient documentation

## 2021-09-23 DIAGNOSIS — I209 Angina pectoris, unspecified: Secondary | ICD-10-CM | POA: Diagnosis present

## 2021-09-23 LAB — CBC
HCT: 39.7 % (ref 36.0–46.0)
Hemoglobin: 13.9 g/dL (ref 12.0–15.0)
MCH: 31.2 pg (ref 26.0–34.0)
MCHC: 35 g/dL (ref 30.0–36.0)
MCV: 89 fL (ref 80.0–100.0)
Platelets: 181 10*3/uL (ref 150–400)
RBC: 4.46 MIL/uL (ref 3.87–5.11)
RDW: 12.4 % (ref 11.5–15.5)
WBC: 4.2 10*3/uL (ref 4.0–10.5)
nRBC: 0 % (ref 0.0–0.2)

## 2021-09-23 LAB — BASIC METABOLIC PANEL
Anion gap: 9 (ref 5–15)
BUN: 16 mg/dL (ref 8–23)
CO2: 27 mmol/L (ref 22–32)
Calcium: 9.4 mg/dL (ref 8.9–10.3)
Chloride: 102 mmol/L (ref 98–111)
Creatinine, Ser: 0.81 mg/dL (ref 0.44–1.00)
GFR, Estimated: >60 mL/min (ref >60)
Glucose, Bld: 96 mg/dL (ref 70–99)
Potassium: 3.6 mmol/L (ref 3.5–5.1)
Sodium: 138 mmol/L (ref 135–145)

## 2021-09-23 LAB — RESP PANEL BY RT-PCR (FLU A&B, COVID) ARPGX2
Influenza A by PCR: NEGATIVE
Influenza B by PCR: NEGATIVE
SARS Coronavirus 2 by RT PCR: NEGATIVE

## 2021-09-23 LAB — BRAIN NATRIURETIC PEPTIDE: B Natriuretic Peptide: 16.6 pg/mL (ref 0.0–100.0)

## 2021-09-23 LAB — D-DIMER, QUANTITATIVE: D-Dimer, Quant: 0.27 ug/mL-FEU (ref 0.00–0.50)

## 2021-09-23 LAB — TROPONIN I (HIGH SENSITIVITY)
Troponin I (High Sensitivity): 4 ng/L (ref <18)
Troponin I (High Sensitivity): 4 ng/L (ref <18)

## 2021-09-23 MED ORDER — MORPHINE SULFATE (PF) 2 MG/ML IV SOLN
2.0000 mg | Freq: Once | INTRAVENOUS | Status: AC
  Administered 2021-09-23: 2 mg via INTRAVENOUS
  Filled 2021-09-23: qty 1

## 2021-09-23 MED ORDER — ACETAMINOPHEN 325 MG PO TABS
650.0000 mg | ORAL_TABLET | Freq: Four times a day (QID) | ORAL | Status: DC | PRN
  Filled 2021-09-23: qty 2

## 2021-09-23 MED ORDER — ASPIRIN 81 MG PO CHEW
324.0000 mg | CHEWABLE_TABLET | Freq: Once | ORAL | Status: AC
  Administered 2021-09-23: 324 mg via ORAL
  Filled 2021-09-23: qty 4

## 2021-09-23 MED ORDER — ONDANSETRON HCL 4 MG/2ML IJ SOLN: 4.0000 mg | Freq: Four times a day (QID) | INTRAMUSCULAR | Status: DC | PRN

## 2021-09-23 MED ORDER — MELATONIN 5 MG PO TABS: 10.0000 mg | ORAL_TABLET | Freq: Every evening | ORAL | Status: DC | PRN

## 2021-09-23 MED ORDER — ACETAMINOPHEN 650 MG RE SUPP: 650.0000 mg | Freq: Four times a day (QID) | RECTAL | Status: DC | PRN

## 2021-09-23 MED ORDER — ONDANSETRON HCL 4 MG PO TABS: 4.0000 mg | ORAL_TABLET | Freq: Four times a day (QID) | ORAL | Status: DC | PRN

## 2021-09-23 NOTE — H&P (Signed)
?History and Physical  ? ? Hausner Gatchalian WGY:[id number] DOB: Feb 10, 1956 DOA: 09/23/2021 ? ?DOS: the patient was seen and examined on 09/23/2021 ? ?PCP: Christen Butter, NP  ? ?Patient coming from: Home ? ?I have personally briefly reviewed patient's old medical records in [facility] ? ?CC: chest pain ?HPI: ?66 yo [place] female without significant medical hx, takes no Rx meds, presents to ED with chest pressure. Pt states on Friday night, she ate fried chicken for dinner, which is a heavy meal for her. She went to bed. Was lying on her left side. She felt chest pressure and palpitations. Last for about 1 hour. Called EMS and taken to [place] [facility]. EKG NSR. 2 set of troponins negative. Pt discharged to home. ? ?On Saturday morning, she slept in late due to late night ER visit. After eating some breakfast, she worked in the yard all day doing Aeronautical engineer. No chest pain or pressure with yard work.  Of Note, pt swam 20 laps in the pool on Thursday. ? ?Went to bed normally on Saturday night. No issues overnight. ? ?Sunday AM, no issues. She felt a little light headed but resolved after drinking some water and milk.  She worked outside in yard again. Maybe a little harder than Saturday. But still no chest pressure or pain with yard work. ? ?Micah Flesher to work today, but has still been thinking about her chest pressure on Friday night.  Pt admits she has been a little nervous about it. ? 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 ? ?Co-worker brought her to [facility].  ? ?Workup in ER negative again with 2 sets negative troponin.  However, while in ER, she developed chest pressure again. Pt sent to [facility] for evaluation. ? ?Pt states her dtr has told her that she stops breathing at night while she is sleeping. Pt have never been evaluated for sleep apnea. Only gets heartburn on a rare occasion. ? ?She swims once a week at the local pool. Able to swim  20 laps without SOB or CP. ? ?No FMHX of CAD, or MI. ? ?Takes no Rx meds or daily Asa.  ? ?ED Course: EKG NSR. 2 sets of troponin negative. Had repeat chest pressure in ED. Sent to [facility]. ? ?Review of Systems:  ?Review of Systems  ?Constitutional: Negative.   ?HENT: Negative.    ?Eyes: Negative.   ?Respiratory: Negative.  Negative for cough, hemoptysis, sputum production, shortness of breath and wheezing.   ?Cardiovascular:  Positive for palpitations. Negative for orthopnea, leg swelling and PND.  ?Gastrointestinal: Negative.   ?Genitourinary: Negative.   ?Musculoskeletal: Negative.   ?Skin: Negative.   ?Neurological:  Positive for tingling.  ?Endo/Heme/Allergies: Negative.   ?Psychiatric/Behavioral: Negative.    ?All other systems reviewed and are negative. ? ?Past Medical History:  ?Diagnosis Date  ? Allergy   ? Anemia   ? as child/ younger   ? GERD (gastroesophageal reflux disease)   ? Hyperlipidemia   ? no meds   ? ? ?Past Surgical History:  ?Procedure Laterality Date  ? APPENDECTOMY    ? CESAREAN SECTION    ? ECTOPIC PREGNANCY SURGERY    ? OVARIAN CYST REMOVAL    ? ? ? reports that she has never smoked. She has never used smokeless tobacco. She reports current alcohol use. She reports that she does not use drugs. ? ?No Known Allergies ? ?Family History  ?Problem  Relation Age of Onset  ? Hypothyroidism Mother   ? Colon cancer Neg Hx   ? Colon polyps Neg Hx   ? Esophageal cancer Neg Hx   ? Rectal cancer Neg Hx   ? Stomach cancer Neg Hx   ? ? ?Prior to Admission medications   ?Medication Sig Start Date End Date Taking? Authorizing Provider  ?acetic acid-hydrocortisone (VOSOL-HC) OTIC solution Place 3 drops into both ears 2 (two) times daily as needed. 03/22/21   Christen Butter, NP  ?Multiple Vitamin (MULTIVITAMIN) tablet Take 1 tablet by mouth daily.    [provider]  ? ? ?Physical Exam: ?Vitals:  ? 09/23/21 1730 09/23/21 1830 09/23/21 1900 09/23/21 2043  ?BP: (!) 123/57 111/65 (!) 112/56 (!) 84/73  ?Pulse: 79  74 78 69  ?Resp: (!) 21 13 16    ?Temp:    98.3 ?F (36.8 ?C)  ?TempSrc:    Oral  ?SpO2: 99% 98% 97% 98%  ?Weight:      ?Height:      ? ? ?Physical Exam ?Vitals and nursing note reviewed.  ?Constitutional:   ?   General: She is not in acute distress. ?   Appearance: Normal appearance. She is normal weight. She is not ill-appearing, toxic-appearing or diaphoretic.  ?HENT:  ?   Head: Normocephalic and atraumatic.  ?   Nose: Nose normal. No rhinorrhea.  ?Eyes:  ?   General: No scleral icterus. ?Cardiovascular:  ?   Rate and Rhythm: Normal rate and regular rhythm.  ?   Pulses: Normal pulses.  ?Pulmonary:  ?   Effort: Pulmonary effort is normal. No respiratory distress.  ?   Breath sounds: Normal breath sounds. No wheezing or rales.  ?Abdominal:  ?   General: Abdomen is flat. Bowel sounds are normal. There is no distension.  ?   Palpations: Abdomen is soft.  ?   Tenderness: There is no abdominal tenderness. There is no guarding.  ?Musculoskeletal:  ?   Right lower leg: No edema.  ?   Left lower leg: No edema.  ?Skin: ?   General: Skin is warm and dry.  ?   Capillary Refill: Capillary refill takes less than 2 seconds.  ?Neurological:  ?   General: No focal deficit present.  ?   Mental Status: She is alert and oriented to person, place, and time.  ?  ? ?Labs on Admission: I have personally reviewed following labs and imaging studies ? ?CBC: ?Recent Labs  ?Lab 09/23/21 ?1307  ?WBC 4.2  ?HGB 13.9  ?HCT 39.7  ?MCV 89.0  ?PLT 181  ? ?Basic Metabolic Panel: ?Recent Labs  ?Lab 09/23/21 ?1307  ?NA 138  ?K 3.6  ?CL 102  ?CO2 27  ?GLUCOSE 96  ?BUN 16  ?CREATININE 0.81  ?CALCIUM 9.4  ? ?GFR: ?Estimated Creatinine Clearance: 63.1 mL/min (by C-G formula based on SCr of 0.81 mg/dL). ?Liver Function Tests: ?No results for input(s): AST, ALT, ALKPHOS, BILITOT, PROT, ALBUMIN in the last 168 hours. ?No results for input(s): LIPASE, AMYLASE in the last 168 hours. ?No results for input(s): AMMONIA in the last 168 hours. ?Coagulation  Profile: ?No results for input(s): INR, PROTIME in the last 168 hours. ?Cardiac Enzymes: ?Recent Labs  ?Lab 09/23/21 ?1307 09/23/21 ?1509  ?TROPONINIHS 4 4  ? ?BNP (last 3 results) ?No results for input(s): PROBNP in the last 8760 hours. ?HbA1C: ?No results for input(s): HGBA1C in the last 72 hours. ?CBG: ?No results for input(s): GLUCAP in the last 168 hours. ?Lipid  Profile: ?No results for input(s): CHOL, HDL, LDLCALC, TRIG, CHOLHDL, LDLDIRECT in the last 72 hours. ?Thyroid Function Tests: ?No results for input(s): TSH, T4TOTAL, FREET4, T3FREE, THYROIDAB in the last 72 hours. ?Anemia Panel: ?No results for input(s): VITAMINB12, FOLATE, FERRITIN, TIBC, IRON, RETICCTPCT in the last 72 hours. ?Urine analysis: ?No results found for: COLORURINE, APPEARANCEUR, LABSPEC, PHURINE, GLUCOSEU, HGBUR, BILIRUBINUR, KETONESUR, PROTEINUR, UROBILINOGEN, NITRITE, LEUKOCYTESUR ? ?Radiological Exams on Admission: I have personally reviewed images ?DG Chest 2 View ? ?Result Date: 09/23/2021 ?CLINICAL DATA:  Shortness of breath. EXAM: CHEST - 2 VIEW COMPARISON:  None. FINDINGS: The heart size and mediastinal contours are within normal limits. Both lungs are clear. The visualized skeletal structures are unremarkable. IMPRESSION: No active cardiopulmonary disease. Electronically Signed   By: Lupita RaiderJames  Green Jr M.D.   On: 09/23/2021 13:00   ? ?EKG: I have personally reviewed EKG: NSR ? ? ?Assessment/Plan ?Principal Problem: ?  Atypical chest pain ?  ? ?Assessment and Plan: ?* Atypical chest pain ?Observation telemetry bed. Keep NPO after MN. Cardiology consult in AM. I think pt would benefit from coronary CT as she has no exertional CP or DOE. Check lipid panel.  Pt did receive some NTG on the floor due to complaints of chest pressure with resultant drop in her SBP to 84.  During interview, pt was talking rapidly and developed some tingling her lips and hands. This tingling ceased after she stopped talking. ? ? ? ?DVT prophylaxis: SCDs ?Code  Status: Full Code ?Family Communication: no family at bedside  ?Disposition Plan: return home  ?Consults called: will send epic message to cardiology  ?Admission status: Observation, Telemetry bed ? ? ?Renita PapaEric Che

## 2021-09-23 NOTE — ED Notes (Signed)
Report given to Karena Addison, receiving nurse at [facility] ?

## 2021-09-23 NOTE — ED Provider Notes (Signed)
?[facility] EMERGENCY DEPARTMENT ?Provider Note ? ? ?CSN: [id number] ?Arrival date & time: 09/23/21  1210 ? ?  ? ?History ? ?Chief Complaint  ?Patient presents with  ? Shortness of Breath  ? ? ?Brittany Travis is a 66 y.o. female. ? ?HPI ? ?  ? ? ?66 year old female with a history of hyperlipidemia recent emergency department visit for chest pain at [facility] who presents with concern for chest pressure.  ? ?Began on Friday night---Saturday ?Went home, Saturday felt better, did some yard work, Sunday same felt better ?This morning began to have cente ro fchest chest pressure, thought bra too tight, changed into an older one but still felt too tight, tried to drink water and go to work and it kept getting worse. Arms and hands felt tingling, lips felt tingling, shoulders tingling ? ?Today also having shortness of breath, like needs to take a deep breath ?Central chest pressure, more pressure when laying down starting now, this AM when working/standing feeling worse, was better sitting down ?Pressure now 5/10, earlier was 8/10, stsarted at 9 or 10 AM, no radiation of the chest pressure ? ?No nausea, abdominal pain, black or bloody stools, fever, cough ? ?No leg pain or swelling, no long flights (week before last flew from [place]), no recent surgeries ? ?No cigarettes, etoh or other drugs ?No family hx of early heart disease ? ? ?Past Medical History:  ?Diagnosis Date  ? Allergy   ? Anemia   ? as child/ younger   ? GERD (gastroesophageal reflux disease)   ? Hyperlipidemia   ? no meds   ?  ? ?Home Medications ?Prior to Admission medications   ?Medication Sig Start Date End Date Taking? Authorizing Provider  ?acetic acid-hydrocortisone (VOSOL-HC) OTIC solution Place 3 drops into both ears 2 (two) times daily as needed. 03/22/21   Samuel Bouche, NP  ?Multiple Vitamin (MULTIVITAMIN) tablet Take 1 tablet by mouth daily.    [provider]  ?   ? ?Allergies    ?Patient has no known allergies.   ? ?Review of  Systems   ?Review of Systems ?See above ?Physical Exam ?Updated Vital Signs ?BP 133/71   Pulse 79   Temp 98.4 ?F (36.9 ?C) (Oral)   Resp 14   Ht 5\' 5"  (1.651 m)   Wt 58.1 kg   SpO2 99%   BMI 21.30 kg/m?  ?Physical Exam ?Vitals and nursing note reviewed.  ?Constitutional:   ?   General: She is not in acute distress. ?   Appearance: She is well-developed. She is not diaphoretic.  ?HENT:  ?   Head: Normocephalic and atraumatic.  ?Eyes:  ?   Conjunctiva/sclera: Conjunctivae normal.  ?Cardiovascular:  ?   Rate and Rhythm: Normal rate and regular rhythm.  ?   Heart sounds: Normal heart sounds. No murmur heard. ?  No friction rub. No gallop.  ?Pulmonary:  ?   Effort: Pulmonary effort is normal. No respiratory distress.  ?   Breath sounds: Normal breath sounds. No wheezing or rales.  ?Abdominal:  ?   General: There is no distension.  ?   Palpations: Abdomen is soft.  ?   Tenderness: There is no abdominal tenderness. There is no guarding.  ?Musculoskeletal:     ?   General: No tenderness.  ?   Cervical back: Normal range of motion.  ?Skin: ?   General: Skin is warm and dry.  ?   Findings: No erythema or rash.  ?Neurological:  ?  Mental Status: She is alert and oriented to person, place, and time.  ? ? ?ED Results / Procedures / Treatments   ?Labs ?(all labs ordered are listed, but only abnormal results are displayed) ?Labs Reviewed  ?RESP PANEL BY RT-PCR (FLU A&B, COVID) [id number]  ?BASIC METABOLIC PANEL  ?CBC  ?D-DIMER, QUANTITATIVE  ?BRAIN NATRIURETIC PEPTIDE  ?TROPONIN I (HIGH SENSITIVITY)  ?TROPONIN I (HIGH SENSITIVITY)  ? ? ?EKG ?EKG Interpretation ? ?Date/Time:  Monday September 23 2021 12:27:20 EDT ?Ventricular Rate:  75 ?PR Interval:  140 ?QRS Duration: 70 ?QT Interval:  352 ?QTC Calculation: 393 ?R Axis:   75 ?Text Interpretation: Normal sinus rhythm Normal ECG No previous ECGs available Confirmed by Gareth Morgan [phone]) on 09/23/2021 1:22:29 PM ? ?Radiology ?DG Chest 2 View ? ?Result Date: 09/23/2021 ?CLINICAL  DATA:  Shortness of breath. EXAM: CHEST - 2 VIEW COMPARISON:  None. FINDINGS: The heart size and mediastinal contours are within normal limits. Both lungs are clear. The visualized skeletal structures are unremarkable. IMPRESSION: No active cardiopulmonary disease. Electronically Signed   By: Marijo Conception M.D.   On: 09/23/2021 13:00   ? ?Procedures ?Procedures  ? ? ?Medications Ordered in ED ?Medications  ?morphine (PF) 2 MG/ML injection 2 mg (has no administration in time range)  ?aspirin chewable tablet 324 mg (324 mg Oral Given 09/23/21 1507)  ? ? ?ED Course/ Medical Decision Making/ A&P ?  ?                        ?Medical Decision Making ?Amount and/or Complexity of Data Reviewed ?Labs: ordered. ?Radiology: ordered. ? ?Risk ?OTC drugs. ? ? ? ?66 year old female with a history of hyperlipidemia recent emergency department visit for chest pain at [facility] who presents with concern for chest pressure.  ? ?Differential diagnosis for chest pain includes pulmonary embolus, dissection, pneumothorax, pneumonia, ACS, myocarditis, pericarditis.   ? ?EKG was done and evaluate by me and showed no acute ST changes and no signs of pericarditis.  ? ?Chest x-ray was done and evaluated by me and radiology and showed no sign of pneumonia or pneumothorax.  She is low risk [place] with a negative ddimer and have low suspicion for PE.  BNP normal, no sign of CHF.  Patient is low risk HEART score and had delta troponins which were both negative.  Do not feel history or exam are consistent with aortic dissection. ? ?Initial discussion given no exertional chest pain over the weekend was for possible discharge if troponin negative, however she developed worsening pain and pressure in the ED, does report that pain is worse when she was moving around today, and given this, HEART score is 4 and given second ED visit, pressure like pain without other explanation, worsening symptoms, will admit for observation. She declines nitro  given BP concerns. Given aspirin. ? ? ? ? ? ? ? ?Final Clinical Impression(s) / ED Diagnoses ?Final diagnoses:  ?Chest pain, unspecified type  ? ? ?Rx / DC Orders ?ED Discharge Orders   ? ? None  ? ?  ? ? ?  ?Gareth Morgan, MD ?09/23/21 1558 ? ?

## 2021-09-23 NOTE — ED Notes (Signed)
Report given to Cherry with [facility]  ?

## 2021-09-23 NOTE — ED Notes (Signed)
States," I feel worse, it may be just anxiety but I have now more pressure in my chest" ED MD informed of same ?

## 2021-09-23 NOTE — ED Notes (Signed)
ED Provider at bedside. 

## 2021-09-23 NOTE — Assessment & Plan Note (Addendum)
Troponins remain flat, LDL 110, A1c 5.3.  Coronary CT was unremarkable.  Patient was seen by cardiology team.  Stable for discharge with outpatient follow-up with PCP in next 2 weeks. ?Advised low-fat diet for elevated LDL, if necessary she can be started on Lipitor ? ?. ?

## 2021-09-23 NOTE — ED Notes (Signed)
Attempted to call report to receiving nurse at [facility] but still in shift report. Will call back  ?

## 2021-09-23 NOTE — Subjective & Objective (Signed)
CC: chest pain ?HPI: ?66 yo [place] female without significant medical hx, takes no Rx meds, presents to ED with chest pressure. Pt states on Friday night, she ate fried chicken for dinner, which is a heavy meal for her. She went to bed. Was lying on her left side. She felt chest pressure and palpitations. Last for about 1 hour. Called EMS and taken to [place] [facility]. EKG NSR. 2 set of troponins negative. Pt discharged to home. ? ?On Saturday morning, she slept in late due to late night ER visit. After eating some breakfast, she worked in the yard all day doing Aeronautical engineer. No chest pain or pressure with yard work.  Of Note, pt swam 20 laps in the pool on Thursday. ? ?Went to bed normally on Saturday night. No issues overnight. ? ?Sunday AM, no issues. She felt a little light headed but resolved after drinking some water and milk.  She worked outside in yard again. Maybe a little harder than Saturday. But still no chest pressure or pain with yard work. ? ?Micah Flesher to work today, but has still been thinking about her chest pressure on Friday night.  Pt admits she has been a little nervous about it. ? 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 ? ?Co-worker brought her to [facility].  ? ?Workup in ER negative again with 2 sets negative troponin.  However, while in ER, she developed chest pressure again. Pt sent to [facility] for evaluation. ? ?Pt states her dtr has told her that she stops breathing at night while she is sleeping. Pt have never been evaluated for sleep apnea. Only gets heartburn on a rare occasion. ? ?She swims once a week at the local pool. Able to swim 20 laps without SOB or CP. ? ?No FMHX of CAD, or MI. ? ?Takes no Rx meds or daily Asa. ?

## 2021-09-23 NOTE — Telephone Encounter (Signed)
Transition Care Management Unsuccessful Follow-up Telephone Call ? ?Date of discharge and from where:  09/21/21 from [facility] ? ?Attempts:  1st Attempt ? ?Reason for unsuccessful TCM follow-up call:  Left voice message ? ?  ?

## 2021-09-23 NOTE — ED Triage Notes (Signed)
Pt arrives pov with c/o shob this am. Denies CP. Was treated at ED on Friday for chest pressure. Denies cough or fever ?

## 2021-09-24 DIAGNOSIS — R002 Palpitations: Secondary | ICD-10-CM

## 2021-09-24 DIAGNOSIS — R0789 Other chest pain: Secondary | ICD-10-CM | POA: Diagnosis not present

## 2021-09-24 DIAGNOSIS — Z20822 Contact with and (suspected) exposure to covid-19: Secondary | ICD-10-CM | POA: Diagnosis not present

## 2021-09-24 DIAGNOSIS — I209 Angina pectoris, unspecified: Secondary | ICD-10-CM | POA: Diagnosis present

## 2021-09-24 DIAGNOSIS — R0602 Shortness of breath: Secondary | ICD-10-CM | POA: Diagnosis not present

## 2021-09-24 LAB — HEMOGLOBIN A1C
Hgb A1c MFr Bld: 5.3 % (ref 4.8–5.6)
Mean Plasma Glucose: 105.41 mg/dL

## 2021-09-24 LAB — HIV ANTIBODY (ROUTINE TESTING W REFLEX): HIV Screen 4th Generation wRfx: NONREACTIVE

## 2021-09-24 LAB — LIPID PANEL
Cholesterol: 185 mg/dL (ref 0–200)
HDL: 57 mg/dL (ref >40)
LDL Cholesterol: 110 mg/dL — ABNORMAL HIGH (ref 0–99)
Total CHOL/HDL Ratio: 3.2 RATIO
Triglycerides: 90 mg/dL (ref <150)
VLDL: 18 mg/dL (ref 0–40)

## 2021-09-24 LAB — GLUCOSE, CAPILLARY: Glucose-Capillary: 89 mg/dL (ref 70–99)

## 2021-09-24 MED ORDER — IPRATROPIUM-ALBUTEROL 0.5-2.5 (3) MG/3ML IN SOLN: 3.0000 mL | RESPIRATORY_TRACT | Status: DC | PRN

## 2021-09-24 MED ORDER — NITROGLYCERIN 0.4 MG SL SUBL
0.4000 mg | SUBLINGUAL_TABLET | SUBLINGUAL | Status: DC | PRN
Start: 2021-09-24 — End: 2021-09-25
  Administered 2021-09-25: 0.4 mg via SUBLINGUAL

## 2021-09-24 MED ORDER — SODIUM CHLORIDE 0.9 % IV BOLUS
1000.0000 mL | Freq: Once | INTRAVENOUS | Status: AC
  Administered 2021-09-24: 1000 mL via INTRAVENOUS

## 2021-09-24 MED ORDER — METOPROLOL TARTRATE 5 MG/5ML IV SOLN: 5.0000 mg | INTRAVENOUS | Status: DC | PRN

## 2021-09-24 MED ORDER — METOPROLOL TARTRATE 25 MG PO TABS
25.0000 mg | ORAL_TABLET | Freq: Once | ORAL | Status: DC
  Filled 2021-09-24: qty 1

## 2021-09-24 MED ORDER — CALCIUM CARBONATE ANTACID 500 MG PO CHEW
1.0000 | CHEWABLE_TABLET | Freq: Three times a day (TID) | ORAL | Status: DC | PRN
  Administered 2021-09-24: 200 mg via ORAL
  Filled 2021-09-24: qty 1

## 2021-09-24 MED ORDER — HYDRALAZINE HCL 20 MG/ML IJ SOLN
10.0000 mg | INTRAMUSCULAR | Status: DC | PRN
Start: 2021-09-24 — End: 2021-09-25

## 2021-09-24 MED ORDER — SENNOSIDES-DOCUSATE SODIUM 8.6-50 MG PO TABS
1.0000 | ORAL_TABLET | Freq: Every evening | ORAL | Status: DC | PRN
Start: 2021-09-24 — End: 2021-09-25

## 2021-09-24 MED ORDER — TRAZODONE HCL 50 MG PO TABS
50.0000 mg | ORAL_TABLET | Freq: Every evening | ORAL | Status: DC | PRN
Start: 2021-09-24 — End: 2021-09-25

## 2021-09-24 MED ORDER — OXYCODONE HCL 5 MG PO TABS: 5.0000 mg | ORAL_TABLET | ORAL | Status: DC | PRN

## 2021-09-24 NOTE — Progress Notes (Signed)
?PROGRESS NOTE ? ? ? Brittany Travis  [id number] DOB: 1955/08/19 DOA: 09/23/2021 ?PCP: Samuel Bouche, NP  ? ?Brief Narrative:  ?66 year old with no known past medical history comes to the hospital complains of atypical chest pain.  Troponins are flat.  Cardiology team consulted. ? ? ?Assessment & Plan: ? Principal Problem: ?  Atypical chest pain ?  ? ? ?Assessment and Plan: ?* Atypical chest pain ?Troponins remain flat, LDL 110.  Check A1c.  Currently chest pain-free. ?Cardiology team has been consulted for their input. ? ?. ? ? ? ? ?IV fluid bolus has been ordered due to dehydration.  She will also be getting IV contrast therefore we will need to keep her hydrated ? ? ? ?DVT prophylaxis: SCDs Start: 09/23/21 2221 ?Code Status: Full code ?Family Communication:   ? ?Maintain hospital stay until evaluated by cardiology team. ? ? ? ? ? ?Subjective: ?Patient denies any lightheadedness, fevers and chills.  She still has some left-sided chest discomfort which is " achy" in nature. ? ?Review of Systems ?Otherwise negative except as per HPI, including: ?General: Denies fever, chills, night sweats or unintended weight loss. ?Resp: Denies cough, wheezing, shortness of breath. ?Cardiac: Denies chest pain, palpitations, orthopnea, paroxysmal nocturnal dyspnea. ?GI: Denies abdominal pain, nausea, vomiting, diarrhea or constipation ?GU: Denies dysuria, frequency, hesitancy or incontinence ?MS: Denies muscle aches, joint pain or swelling ?Neuro: Denies headache, neurologic deficits (focal weakness, numbness, tingling), abnormal gait ?Psych: Denies anxiety, depression, SI/HI/AVH ?Skin: Denies new rashes or lesions ?ID: Denies sick contacts, exotic exposures, travel ? ?Examination: ? ?General exam: Appears calm and comfortable  ?Respiratory system: Clear to auscultation. Respiratory effort normal. ?Cardiovascular system: S1 & S2 heard, RRR. No JVD, murmurs, rubs, gallops or clicks. No pedal edema. ?Gastrointestinal system: Abdomen is  nondistended, soft and nontender. No organomegaly or masses felt. Normal bowel sounds heard. ?Central nervous system: Alert and oriented. No focal neurological deficits. ?Extremities: Symmetric 5 x 5 power. ?Skin: No rashes, lesions or ulcers ?Psychiatry: Judgement and insight appear normal. Mood & affect appropriate.  ? ? ? ?Objective: ?Vitals:  ? 09/23/21 2347 09/24/21 0006 09/24/21 [id number] 09/24/21 [id number]  ?BP: (!) 100/55 (!) 99/56 (!) 101/56 114/65  ?Pulse: 78 68 72 69  ?Resp: 18 14 16 12   ?Temp: 98 ?F (36.7 ?C) 98.2 ?F (36.8 ?C) 98 ?F (36.7 ?C)   ?TempSrc: Oral Oral Oral Oral  ?SpO2: 97% 98% 98% 98%  ?Weight:      ?Height:      ? ? ?Intake/Output Summary (Last 24 hours) at 09/24/2021 0915 ?Last data filed at 09/23/2021 2102 ?Gross per 24 hour  ?Intake 240 ml  ?Output --  ?Net 240 ml  ? ?Filed Weights  ? 09/23/21 1222  ?Weight: 58.1 kg  ? ? ? ?Data Reviewed:  ? ?CBC: ?Recent Labs  ?Lab 09/23/21 ?1307  ?WBC 4.2  ?HGB 13.9  ?HCT 39.7  ?MCV 89.0  ?PLT 181  ? ?Basic Metabolic Panel: ?Recent Labs  ?Lab 09/23/21 ?1307  ?NA 138  ?K 3.6  ?CL 102  ?CO2 27  ?GLUCOSE 96  ?BUN 16  ?CREATININE 0.81  ?CALCIUM 9.4  ? ?GFR: ?Estimated Creatinine Clearance: 63.1 mL/min (by C-G formula based on SCr of 0.81 mg/dL). ?Liver Function Tests: ?No results for input(s): AST, ALT, ALKPHOS, BILITOT, PROT, ALBUMIN in the last 168 hours. ?No results for input(s): LIPASE, AMYLASE in the last 168 hours. ?No results for input(s): AMMONIA in the last 168 hours. ?Coagulation Profile: ?No results for input(s): INR, PROTIME in the  last 168 hours. ?Cardiac Enzymes: ?No results for input(s): CKTOTAL, CKMB, CKMBINDEX, TROPONINI in the last 168 hours. ?BNP (last 3 results) ?No results for input(s): PROBNP in the last 8760 hours. ?HbA1C: ?No results for input(s): HGBA1C in the last 72 hours. ?CBG: ?No results for input(s): GLUCAP in the last 168 hours. ?Lipid Profile: ?Recent Labs  ?  09/24/21 ?0255  ?CHOL 185  ?HDL 57  ?LDLCALC 110*  ?TRIG 90  ?CHOLHDL 3.2   ? ?Thyroid Function Tests: ?No results for input(s): TSH, T4TOTAL, FREET4, T3FREE, THYROIDAB in the last 72 hours. ?Anemia Panel: ?No results for input(s): VITAMINB12, FOLATE, FERRITIN, TIBC, IRON, RETICCTPCT in the last 72 hours. ?Sepsis Labs: ?No results for input(s): PROCALCITON, LATICACIDVEN in the last 168 hours. ? ?Recent Results (from the past 240 hour(s))  ?Resp Panel by RT-PCR (Flu A&B, Covid) Nasopharyngeal Swab     Status: None  ? Collection Time: 09/23/21  4:21 PM  ? Specimen: Nasopharyngeal Swab; Nasopharyngeal(NP) swabs in vial transport medium  ?Result Value Ref Range Status  ? SARS Coronavirus 2 by RT PCR NEGATIVE NEGATIVE Final  ?  Comment: (NOTE) ?SARS-CoV-2 target nucleic acids are NOT DETECTED. ? ?The SARS-CoV-2 RNA is generally detectable in upper respiratory ?specimens during the acute phase of infection. The lowest ?concentration of SARS-CoV-2 viral copies this assay can detect is ?138 copies/mL. A negative result does not preclude SARS-Cov-2 ?infection and should not be used as the sole basis for treatment or ?other patient management decisions. A negative result may occur with  ?improper specimen collection/handling, submission of specimen other ?than nasopharyngeal swab, presence of viral mutation(s) within the ?areas targeted by this assay, and inadequate number of viral ?copies(<138 copies/mL). A negative result must be combined with ?clinical observations, patient history, and epidemiological ?information. The expected result is Negative. ? ?Fact Sheet for Patients:  ?EntrepreneurPulse.com.au ? ?Fact Sheet for Healthcare Providers:  ?IncredibleEmployment.be ? ?This test is no t yet approved or cleared by the [place] FDA and  ?has been authorized for detection and/or diagnosis of SARS-CoV-2 by ?FDA under an Emergency Use Authorization (EUA). This EUA will remain  ?in effect (meaning this test can be used) for the duration of the ?COVID-19  declaration under Section 564(b)(1) of the Act, 21 ?U.S.C.section 360bbb-3(b)(1), unless the authorization is terminated  ?or revoked sooner.  ? ? ?  ? Influenza A by PCR NEGATIVE NEGATIVE Final  ? Influenza B by PCR NEGATIVE NEGATIVE Final  ?  Comment: (NOTE) ?The Xpert Xpress SARS-CoV-2/FLU/RSV plus assay is intended as an aid ?in the diagnosis of influenza from Nasopharyngeal swab specimens and ?should not be used as a sole basis for treatment. Nasal washings and ?aspirates are unacceptable for Xpert Xpress SARS-CoV-2/FLU/RSV ?testing. ? ?Fact Sheet for Patients: ?EntrepreneurPulse.com.au ? ?Fact Sheet for Healthcare Providers: ?IncredibleEmployment.be ? ?This test is not yet approved or cleared by the [place] FDA and ?has been authorized for detection and/or diagnosis of SARS-CoV-2 by ?FDA under an Emergency Use Authorization (EUA). This EUA will remain ?in effect (meaning this test can be used) for the duration of the ?COVID-19 declaration under Section 564(b)(1) of the Act, 21 U.S.C. ?section 360bbb-3(b)(1), unless the authorization is terminated or ?revoked. ? ?Performed at [facility], [address] ?[place], [place] [address] ?  ?  ? ? ? ? ? ?Radiology Studies: ?DG Chest 2 View ? ?Result Date: 09/23/2021 ?CLINICAL DATA:  Shortness of breath. EXAM: CHEST - 2 VIEW COMPARISON:  None. FINDINGS: The heart size and  mediastinal contours are within normal limits. Both lungs are clear. The visualized skeletal structures are unremarkable. IMPRESSION: No active cardiopulmonary disease. Electronically Signed   By: Marijo Conception M.D.   On: 09/23/2021 13:00   ? ? ? ? ? ?Scheduled Meds: ?Continuous Infusions: ? ? LOS: 1 day  ? ?Time spent= 35 mins ? ? ? ?Brittany Voorhies Arsenio Loader, MD ?[facility] ? ?If 7PM-7AM, please contact night-coverage ? ?09/24/2021, 9:15 AM  ? ?

## 2021-09-24 NOTE — Consult Note (Addendum)
?Cardiology Consultation:  ? ?Patient ID: Brittany Travis ?MRN: [id number]; DOB: 09/09/1955 ? ?Admit date: 09/23/2021 ?Date of Consult: 09/24/2021 ? ?PCP:  Brittany Butter, NP ?  ?[facility] Providers ?Cardiologist:  None   New ? ?Patient Profile:  ? ?Brittany Travis is a 66 y.o. female with no significant PMH who is being seen 09/24/2021 for the evaluation of chest pain at the request of Dr. Imogene Burn. ? ?History of Present Illness:  ? ?[name] is a 66 yo female with no significant PMH.  She has never been evaluated by cardiology in the past.  She does not take any home medications on a regular basis.  Denies any significant family history.  She reports an episode of chest pain Friday evening that started after she laid down to rest.  She was concerned and then up calling EMS.  She was taken to [facility].  EKG was without ischemia in ruled out.  She was discharged home.  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  She also swims on a regular basis and has not had any anginal symptoms in the setting. ? ?Went to bed Sunday evening with no issues.  Sunday morning she got up and got dressed to go to work.  While at work she developed recurrent centralized chest pressure with numbness and tingling in her arms and around her mouth.  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  ? ?Labs in the ED showed sodium 138, potassium 3.6, creatinine 0.81, BNP 16, high-sensitivity troponin 4>> 4, WBC 4.2, hemoglobin 13.9, D-dimer 0.27.  EKG showed sinus rhythm, 75 bpm, no acute ST/T wave abnormalities.  Initially was planned for discharge but had recurrent episode of left-sided chest pain.  Given this is her second ER visit she was transferred to [facility] for further evaluation and admission.  ? ?Past Medical  History:  ?Diagnosis Date  ? Allergy   ? Anemia   ? as child/ younger   ? GERD (gastroesophageal reflux disease)   ? Hyperlipidemia   ? no meds   ? ? ?Past Surgical History:  ?Procedure Laterality Date  ? APPENDECTOMY    ? CESAREAN SECTION    ? ECTOPIC PREGNANCY SURGERY    ? OVARIAN CYST REMOVAL    ?  ? ?Home Medications:  ?Prior to Admission medications   ?Medication Sig Start Date End Date Taking? Authorizing Provider  ?loratadine (CLARITIN) 10 MG tablet Take 10 mg by mouth daily as needed for allergies.   Yes [provider]  ?Multiple Vitamin (MULTIVITAMIN) tablet Take 1 tablet by mouth daily.   Yes [provider]  ?naphazoline-pheniramine (NAPHCON-A) 0.025-0.3 % ophthalmic solution Place 1 drop into both eyes 4 (four) times daily as needed for eye irritation or allergies.   Yes [provider]  ?vitamin C (ASCORBIC ACID) 500 MG tablet Take 500 mg by mouth daily.   Yes [provider]  ? ? ?Inpatient Medications: ?Scheduled Meds: ? ?Continuous Infusions: ? ?PRN Meds: ?acetaminophen **OR** acetaminophen, hydrALAZINE, ipratropium-albuterol, melatonin, metoprolol tartrate, ondansetron **OR** ondansetron (ZOFRAN) IV, oxyCODONE, senna-docusate, traZODone ? ?Allergies:    ?Allergies  ?Allergen Reactions  ? Almond (Diagnostic) Itching  ? Apple Itching  ?  Cannot eat fresh fruit  ? Peach [Prunus Persica]  Itching  ?  Cannot eat fresh fruit  ? Pear Itching  ?  Cannot eat fresh fruit  ? Soy Allergy Itching  ?  Milk products only  ? ? ?Social History:   ?Social History  ? ?Socioeconomic History  ? Marital status: Married  ?  Spouse name: Not on file  ? Number of children: Not on file  ? Years of education: Not on file  ? Highest education level: Not on file  ?Occupational History  ? Not on file  ?Tobacco Use  ? Smoking status: Never  ? Smokeless tobacco: Never  ?Vaping Use  ? Vaping Use: Former  ?Substance and Sexual Activity  ? Alcohol use: Yes  ?  Comment: Rarely  ? Drug use: Never  ?  Sexual activity: Yes  ?  Partners: Male  ?  Birth control/protection: Post-menopausal  ?Other Topics Concern  ? Not on file  ?Social History Narrative  ? Not on file  ? ?Social Determinants of Health  ? ?Financial Resource Strain: Not on file  ?Food Insecurity: Not on file  ?Transportation Needs: Not on file  ?Physical Activity: Not on file  ?Stress: Not on file  ?Social Connections: Not on file  ?Intimate Partner Violence: Not on file  ?  ?Family History:   ? ?Family History  ?Problem Relation Age of Onset  ? Hypothyroidism Mother   ? Colon cancer Neg Hx   ? Colon polyps Neg Hx   ? Esophageal cancer Neg Hx   ? Rectal cancer Neg Hx   ? Stomach cancer Neg Hx   ?  ? ?ROS:  ?Please see the history of present illness.  ? ?All other ROS reviewed and negative.    ? ?Physical Exam/Data:  ? ?Vitals:  ? 09/24/21 0436 09/24/21 0836 09/24/21 0919 09/24/21 1200  ?BP: (!) 101/56 114/65 107/63 (!) 115/57  ?Pulse: 72 69 64 70  ?Resp: 16 12 16 17   ?Temp: 98 ?F (36.7 ?C)  98.1 ?F (36.7 ?C) 98.1 ?F (36.7 ?C)  ?TempSrc: Oral Oral Oral Oral  ?SpO2: 98% 98% 98% 97%  ?Weight:      ?Height:      ? ? ?Intake/Output Summary (Last 24 hours) at 09/24/2021 1410 ?Last data filed at 09/23/2021 2102 ?Gross per 24 hour  ?Intake 240 ml  ?Output --  ?Net 240 ml  ? ?Last 3 Weights 09/23/2021 03/22/2021 05/10/2020  ?Weight (lbs) 128 lb 128 lb 4.8 oz 126 lb  ?Weight (kg) 58.06 kg 58.196 kg 57.153 kg  ?   ?Body mass index is 21.3 kg/m?.  ?General:  Well nourished, well developed, in no acute distress ?HEENT: normal ?Neck: no JVD ?Vascular: No carotid bruits; Distal pulses 2+ bilaterally ?Cardiac:  normal S1, S2; RRR; no murmur  ?Lungs:  clear to auscultation bilaterally, no wheezing, rhonchi or rales  ?Abd: soft, nontender, no hepatomegaly  ?Ext: no edema ?Musculoskeletal:  No deformities, BUE and BLE strength normal and equal ?Skin: warm and dry  ?Neuro:  CNs 2-12 intact, no focal abnormalities noted ?Psych:  Normal affect  ? ?EKG:  The EKG was personally  reviewed and demonstrates:  SR, 75 bpm no acute ST/T wave changes  ? ? ?Relevant CV Studies: ? ?N/a  ? ?Laboratory Data: ? ?High Sensitivity Troponin:   ?Recent Labs  ?Lab 09/23/21 ?1307 09/23/21 ?1509  ?TROPONINIHS 4 4  ?   ?Chemistry ?Recent Labs  ?Lab 09/23/21 ?1307  ?NA 138  ?K 3.6  ?CL 102  ?CO2 27  ?GLUCOSE 96  ?  BUN 16  ?CREATININE 0.81  ?CALCIUM 9.4  ?GFRNONAA >60  ?ANIONGAP 9  ?  ?No results for input(s): PROT, ALBUMIN, AST, ALT, ALKPHOS, BILITOT in the last 168 hours. ?Lipids  ?Recent Labs  ?Lab 09/24/21 ?0255  ?CHOL 185  ?TRIG 90  ?HDL 57  ?LDLCALC 110*  ?CHOLHDL 3.2  ?  ?Hematology ?Recent Labs  ?Lab 09/23/21 ?1307  ?WBC 4.2  ?RBC 4.46  ?HGB 13.9  ?HCT 39.7  ?MCV 89.0  ?MCH 31.2  ?MCHC 35.0  ?RDW 12.4  ?PLT 181  ? ?Thyroid No results for input(s): TSH, FREET4 in the last 168 hours.  ?BNP ?Recent Labs  ?Lab 09/23/21 ?1359  ?BNP 16.6  ?  ?DDimer  ?Recent Labs  ?Lab 09/23/21 ?1359  ?DDIMER 0.27  ? ? ? ?Radiology/Studies:  ?DG Chest 2 View ? ?Result Date: 09/23/2021 ?CLINICAL DATA:  Shortness of breath. EXAM: CHEST - 2 VIEW COMPARISON:  None. FINDINGS: The heart size and mediastinal contours are within normal limits. Both lungs are clear. The visualized skeletal structures are unremarkable. IMPRESSION: No active cardiopulmonary disease. Electronically Signed   By: Lupita RaiderJames  Green Jr M.D.   On: 09/23/2021 13:00   ? ? ?Assessment and Plan:  ? ?Brittany Travis is a 66 y.o. female with no significant PMH who is being seen 09/24/2021 for the evaluation of chest pain at the request of Dr. Imogene Burnhen. ? ?Atypical chest pain: Has had several episodes of chest pressure and left-sided chest pain.  Has had intermittent episodes since admission currently with some mild left-sided chest pressure.  High-sensitivity troponin negative x2.  EKG without acute ischemia.  Her ongoing chest discomfort seems reasonable to proceed with coronary CTA to rule out obstructive CAD, and further risk stratify ? ?Hyperlipidemia: LDL 110 ?-- has not been on  meds PTA, further recs pending coronary CTA ? ?Risk Assessment/Risk Scores:  ? ?HEAR Score (for undifferentiated chest pain):  HEAR Score: 2{ ? ?For questions or updates, please contact [facility] ?[place]

## 2021-09-25 ENCOUNTER — Observation Stay (HOSPITAL_COMMUNITY): Payer: BC Managed Care – PPO

## 2021-09-25 DIAGNOSIS — R0789 Other chest pain: Secondary | ICD-10-CM | POA: Diagnosis not present

## 2021-09-25 LAB — BASIC METABOLIC PANEL
Anion gap: 8 (ref 5–15)
BUN: 11 mg/dL (ref 8–23)
CO2: 26 mmol/L (ref 22–32)
Calcium: 9.1 mg/dL (ref 8.9–10.3)
Chloride: 106 mmol/L (ref 98–111)
Creatinine, Ser: 0.7 mg/dL (ref 0.44–1.00)
GFR, Estimated: >60 mL/min (ref >60)
Glucose, Bld: 94 mg/dL (ref 70–99)
Potassium: 3.5 mmol/L (ref 3.5–5.1)
Sodium: 140 mmol/L (ref 135–145)

## 2021-09-25 LAB — MAGNESIUM: Magnesium: 2 mg/dL (ref 1.7–2.4)

## 2021-09-25 MED ORDER — POTASSIUM CHLORIDE CRYS ER 20 MEQ PO TBCR
40.0000 meq | EXTENDED_RELEASE_TABLET | Freq: Once | ORAL | Status: AC
  Administered 2021-09-25: 40 meq via ORAL
  Filled 2021-09-25: qty 2

## 2021-09-25 MED ORDER — IOHEXOL 350 MG/ML SOLN
95.0000 mL | Freq: Once | INTRAVENOUS | Status: AC | PRN
  Administered 2021-09-25: 95 mL via INTRAVENOUS

## 2021-09-25 MED ORDER — NITROGLYCERIN 0.4 MG SL SUBL
SUBLINGUAL_TABLET | SUBLINGUAL | Status: AC
  Filled 2021-09-25: qty 1

## 2021-09-25 NOTE — Telephone Encounter (Signed)
Transition Care Management Unsuccessful Follow-up Telephone Call ? ?Date of discharge and from where:  09/21/21 from [place] ? ?Attempts:  2nd Attempt ? ?Reason for unsuccessful TCM follow-up call:  Left voice message ? ? ? ?

## 2021-09-25 NOTE — Progress Notes (Signed)
? ?Progress Note ? ?Patient Name: Brittany Travis ?Date of Encounter: 09/25/2021 ? ?[facility] Cardiologist: None  ? ?Subjective  ? ?Better this morning. Mild left sided chest discomfort at times ? ?Inpatient Medications  ?  ?Scheduled Meds: ? metoprolol tartrate  25 mg Oral Once  ? nitroGLYCERIN      ? potassium chloride  40 mEq Oral Once  ? ?Continuous Infusions: ? ?PRN Meds: ?acetaminophen **OR** acetaminophen, calcium carbonate, hydrALAZINE, ipratropium-albuterol, melatonin, nitroGLYCERIN, ondansetron **OR** ondansetron (ZOFRAN) IV, oxyCODONE, senna-docusate, traZODone  ? ?Vital Signs  ?  ?Vitals:  ? 09/24/21 1600 09/24/21 1900 09/25/21 0036 09/25/21 0420  ?BP: 111/66 122/69 119/64 108/64  ?Pulse: 76 82 66 70  ?Resp: 15 15 12 17   ?Temp: (!) 97.5 ?F (36.4 ?C) 98.3 ?F (36.8 ?C) 98.1 ?F (36.7 ?C) 98.2 ?F (36.8 ?C)  ?TempSrc: Oral Oral Oral Oral  ?SpO2: 98% 98% 99% 98%  ?Weight:      ?Height:      ? ? ?Intake/Output Summary (Last 24 hours) at 09/25/2021 0851 ?Last data filed at 09/25/2021 0000 ?Gross per 24 hour  ?Intake 390 ml  ?Output 2 ml  ?Net 388 ml  ? ? ?  09/23/2021  ? 12:22 PM 03/22/2021  ?  9:42 AM 05/10/2020  ?  9:06 AM  ?Last 3 Weights  ?Weight (lbs) 128 lb 128 lb 4.8 oz 126 lb  ?Weight (kg) 58.06 kg 58.196 kg 57.153 kg  ?   ? ?Telemetry  ?  ?SR rate 60s - Personally Reviewed ? ?ECG  ?  ?No new tracing  ? ?Physical Exam  ? ?GEN: No acute distress.   ?Neck: No JVD ?Cardiac: RRR, no murmurs, rubs, or gallops.  ?Respiratory: Clear to auscultation bilaterally. ?GI: Soft, nontender, non-distended  ?MS: No edema; No deformity. ?Neuro:  Nonfocal  ?Psych: Normal affect  ? ?Labs  ?  ?High Sensitivity Troponin:   ?Recent Labs  ?Lab 09/23/21 ?1307 09/23/21 ?1509  ?TROPONINIHS 4 4  ?   ?Chemistry ?Recent Labs  ?Lab 09/23/21 ?1307 09/25/21 ?0220  ?NA 138 140  ?K 3.6 3.5  ?CL 102 106  ?CO2 27 26  ?GLUCOSE 96 94  ?BUN 16 11  ?CREATININE 0.81 0.70  ?CALCIUM 9.4 9.1  ?MG  --  2.0  ?GFRNONAA >60 >60  ?ANIONGAP 9 8  ?  ?Lipids   ?Recent Labs  ?Lab 09/24/21 ?0255  ?CHOL 185  ?TRIG 90  ?HDL 57  ?LDLCALC 110*  ?CHOLHDL 3.2  ?  ?Hematology ?Recent Labs  ?Lab 09/23/21 ?1307  ?WBC 4.2  ?RBC 4.46  ?HGB 13.9  ?HCT 39.7  ?MCV 89.0  ?MCH 31.2  ?MCHC 35.0  ?RDW 12.4  ?PLT 181  ? ?Thyroid No results for input(s): TSH, FREET4 in the last 168 hours.  ?BNP ?Recent Labs  ?Lab 09/23/21 ?1359  ?BNP 16.6  ?  ?DDimer  ?Recent Labs  ?Lab 09/23/21 ?1359  ?DDIMER 0.27  ?  ? ?Radiology  ?  ?DG Chest 2 View ? ?Result Date: 09/23/2021 ?CLINICAL DATA:  Shortness of breath. EXAM: CHEST - 2 VIEW COMPARISON:  None. FINDINGS: The heart size and mediastinal contours are within normal limits. Both lungs are clear. The visualized skeletal structures are unremarkable. IMPRESSION: No active cardiopulmonary disease. Electronically Signed   By: 09/25/2021 M.D.   On: 09/23/2021 13:00  ? ?CT CORONARY MORPH W/CTA COR W/SCORE W/CA W/CM &/OR WO/CM ? ?Result Date: 09/25/2021 ?EXAM: OVER-READ INTERPRETATION  CT CHEST The following report is an over-read performed by  radiologist Dr. Trudie Reed of [facility] on 09/25/2021. This over-read does not include interpretation of cardiac or coronary anatomy or pathology. The coronary calcium score/coronary CTA interpretation by the cardiologist is attached. COMPARISON:  None. FINDINGS: Atherosclerotic calcifications are noted in the thoracic aorta. Within the visualized portions of the thorax there are no suspicious appearing pulmonary nodules or masses, there is no acute consolidative airspace disease, no pleural effusions, no pneumothorax and no lymphadenopathy. Visualized portions of the upper abdomen are unremarkable. There are no aggressive appearing lytic or blastic lesions noted in the visualized portions of the skeleton. IMPRESSION: 1.  Aortic Atherosclerosis (ICD10-I70.0). Electronically Signed   By: Trudie Reed M.D.   On: 09/25/2021 08:13   ? ?Cardiac Studies  ? ?Coronary CTA ? ?Patient Profile  ?   ?66 y.o.  female with no significant PMH who is being seen 09/24/2021 for the evaluation of chest pain at the request of Dr. Imogene Burn. ?  ?Assessment & Plan  ?  ?Atypical chest pain: Has had several episodes of chest pressure and left-sided chest pain.  Has had intermittent episodes since admission currently with some mild left-sided chest pressure.  High-sensitivity troponin negative x2.  EKG without acute ischemia. Underwent coronary CTA this morning. Results pending ?  ?Hyperlipidemia: LDL 110 ?-- has not been on meds PTA, further recs pending coronary CTA ? ?For questions or updates, please contact [facility] ?Please consult [email or web address] for contact info under  ? ?  ?   ?Signed, ?Laverda Page, NP  ?09/25/2021, 8:51 AM   ? ?

## 2021-09-25 NOTE — Discharge Summary (Signed)
Physician Discharge Summary  ?Brittany Travis ZOX:[id number] DOB: 18-Jul-1955 DOA: 09/23/2021 ? ?PCP: Christen ButterJessup, [name], NP ? ?Admit date: 09/23/2021 ?Discharge date: 09/25/2021 ? ?Admitted From: Home ?Disposition: Home ? ?Recommendations for Outpatient Follow-up:  ?Follow up with PCP in 1-2 weeks ?Please obtain BMP/CBC in one week your next doctors visit.  ? ? ? ?Discharge Condition: Stable ?CODE STATUS: Full code ?Diet recommendation: Low-fat ? ?Brief/Interim Summary: ?66 year old with no known past medical history comes to the hospital complains of atypical chest pain.  Troponins are flat.  Cardiology team consulted.  Patient underwent coronary CT which was overall unremarkable.  LDL was noted to be 110, A1c 5.3.  She can either start with lifestyle modification to help with her elevated LDL or can be started on Lipitor.  She will need to follow-up with PCP in about 1-2 weeks. ? ?All the questions answered. ?  ? ? ?Assessment and Plan: ?* Atypical chest pain ?Troponins remain flat, LDL 110, A1c 5.3.  Coronary CT was unremarkable.  Patient was seen by cardiology team.  Stable for discharge with outpatient follow-up with PCP in next 2 weeks. ?Advised low-fat diet for elevated LDL, if necessary she can be started on Lipitor ? ?. ? ? ? ? ?  ?Body mass index is 21.3 kg/m?. ? ?  ? ? ? ?Discharge Diagnoses:  ?Principal Problem: ?  Atypical chest pain ? ? ? ? ? ?Consultations: ?Cardiology ? ?Subjective: ?Feels well no complaints, remains chest pain-free ? ?Discharge Exam: ?Vitals:  ? 09/25/21 0036 09/25/21 0420  ?BP: 119/64 108/64  ?Pulse: 66 70  ?Resp: 12 17  ?Temp: 98.1 ?F (36.7 ?C) 98.2 ?F (36.8 ?C)  ?SpO2: 99% 98%  ? ?Vitals:  ? 09/24/21 1600 09/24/21 1900 09/25/21 0036 09/25/21 0420  ?BP: 111/66 122/69 119/64 108/64  ?Pulse: 76 82 66 70  ?Resp: 15 15 12 17   ?Temp: (!) 97.5 ?F (36.4 ?C) 98.3 ?F (36.8 ?C) 98.1 ?F (36.7 ?C) 98.2 ?F (36.8 ?C)  ?TempSrc: Oral Oral Oral Oral  ?SpO2: 98% 98% 99% 98%  ?Weight:      ?Height:      ? ? ?General:  Pt is alert, awake, not in acute distress ?Cardiovascular: RRR, S1/S2 +, no rubs, no gallops ?Respiratory: CTA bilaterally, no wheezing, no rhonchi ?Abdominal: Soft, NT, ND, bowel sounds + ?Extremities: no edema, no cyanosis ? ?Discharge Instructions ? ? ?Allergies as of 09/25/2021   ? ?   Reactions  ? Almond (diagnostic) Itching  ? Apple Itching  ? Cannot eat fresh fruit  ? Peach [prunus Persica] Itching  ? Cannot eat fresh fruit  ? Pear Itching  ? Cannot eat fresh fruit  ? ?  ? ?  ?Medication List  ?  ? ?TAKE these medications   ? ?loratadine 10 MG tablet ?Commonly known as: CLARITIN ?Take 10 mg by mouth daily as needed for allergies. ?  ?multivitamin tablet ?Take 1 tablet by mouth daily. ?  ?naphazoline-pheniramine 0.025-0.3 % ophthalmic solution ?Commonly known as: NAPHCON-A ?Place 1 drop into both eyes 4 (four) times daily as needed for eye irritation or allergies. ?  ?vitamin C 500 MG tablet ?Commonly known as: ASCORBIC ACID ?Take 500 mg by mouth daily. ?  ? ?  ? ? Follow-up Information   ? ? Christen ButterJessup, [name], NP Follow up in 1 week(s).   ?Specialty: Nurse Practitioner ?Contact information: ?[address] ?[address] ?[place] [address] ?[phone] ? ? ?  ?  ? ?  ?  ? ?  ? ?  Allergies  ?Allergen Reactions  ? Almond (Diagnostic) Itching  ? Apple Itching  ?  Cannot eat fresh fruit  ? Peach [Prunus Persica] Itching  ?  Cannot eat fresh fruit  ? Pear Itching  ?  Cannot eat fresh fruit  ? ? ?You were cared for by a hospitalist during your hospital stay. If you have any questions about your discharge medications or the care you received while you were in the hospital after you are discharged, you can call the unit and asked to speak with the hospitalist on call if the hospitalist that took care of you is not available. Once you are discharged, your primary care physician will handle any further medical issues. Please note that no refills for any discharge medications will be authorized once you are discharged,  as it is imperative that you return to your primary care physician (or establish a relationship with a primary care physician if you do not have one) for your aftercare needs so that they can reassess your need for medications and monitor your lab values. ? ? ?Procedures/Studies: ?DG Chest 2 View ? ?Result Date: 09/23/2021 ?CLINICAL DATA:  Shortness of breath. EXAM: CHEST - 2 VIEW COMPARISON:  None. FINDINGS: The heart size and mediastinal contours are within normal limits. Both lungs are clear. The visualized skeletal structures are unremarkable. IMPRESSION: No active cardiopulmonary disease. Electronically Signed   By: Lupita Raider M.D.   On: 09/23/2021 13:00  ? ?CT CORONARY MORPH W/CTA COR W/SCORE W/CA W/CM &/OR WO/CM ? ?Addendum Date: 09/25/2021   ?ADDENDUM REPORT: 09/25/2021 11:11 CLINICAL DATA:  66 Year old Asian Female EXAM: Cardiac/Coronary  CTA TECHNIQUE: The patient was scanned on a [employer]. FINDINGS: Scan was triggered in the descending thoracic aorta. Axial non-contrast 3 mm slices were carried out through the heart. The data set was analyzed on a dedicated work station and scored using the Agatson method. Gantry rotation speed was 250 msecs and collimation was .6 mm. 0.8 mg of sl NTG was given. The 3D data set was reconstructed in 5% intervals of the 67-82 % of the R-R cycle. Diastolic phases were analyzed on a dedicated work station using MPR, MIP and VRT modes. The patient received 95 cc of contrast. Aorta:  Normal size.  Aortic atherosclerosis.  No dissection. Main Pulmonary Artery: Normal size of the pulmonary artery. Aortic Valve:  Tri-leaflet.  No calcifications. Coronary Arteries:  Normal coronary origin.  Right dominance. Coronary Calcium Score: Left main: 0 Left anterior descending artery: 0 Left circumflex artery: 0 Right coronary artery: 0 Total: 0 Percentile: 1st for age, sex, and race matched control. RCA is a large dominant artery that gives rise to PDA and PLA. There is no  significant plaque. Mid RCA step artifact. Left main is a large artery that gives rise to LAD & LCX arteries. There is no significant plaque. LAD is a large vessel that gives rise to one large D1 Branch. There is no significant plaque. Proximal LAD step artifact. LCX is a non-dominant artery that gives rise to one small OM1 branch. There is no significant plaque. Other findings: Normal pulmonary vein drainage into the left atrium. Normal left atrial appendage without a thrombus. Extra-cardiac findings: See attached radiology report for non-cardiac structures. IMPRESSION: 1. Coronary calcium score of 0. This was 1st percentile for age, sex, and race matched control. 2. Normal coronary origin with right dominance. 3. Aortic atherosclerosis. 4. CAD-RADS 0. No evidence of CAD (0%). Consider non-atherosclerotic causes of chest  pain. RECOMMENDATIONS: Coronary artery calcium (CAC) score is a strong predictor of incident coronary heart disease (CHD) and provides predictive information beyond traditional risk factors. CAC scoring is reasonable to use in the decision to withhold, postpone, or initiate statin therapy in intermediate-risk or selected borderline-risk asymptomatic adults (age 25-75 years and LDL-C >=70 to <190 mg/dL) who do not have diabetes or established atherosclerotic cardiovascular disease (ASCVD).* In intermediate-risk (10-year ASCVD risk >=7.5% to <20%) adults or selected borderline-risk (10-year ASCVD risk >=5% to <7.5%) adults in whom a CAC score is measured for the purpose of making a treatment decision the following recommendations have been made: If CAC = 0, it is reasonable to withhold statin therapy and reassess in 5 to 10 years, as long as higher risk conditions are absent (diabetes mellitus, family history of premature CHD in first degree relatives (males <55 years; females <65 years), cigarette smoking, LDL >=190 mg/dL or other independent risk factors). If CAC is 1 to 99, it is reasonable to  initiate statin therapy for patients >=66 years of age. If CAC is >=100 or >=75th percentile, it is reasonable to initiate statin therapy at any age. Cardiology referral should be considered for patients

## 2021-09-27 NOTE — Telephone Encounter (Signed)
Transition Care Management Unsuccessful Follow-up Telephone Call ? ?Date of discharge and from where:  09/21/21 from [facility] ? ?Attempts:  3rd Attempt ? ?Reason for unsuccessful TCM follow-up call:  Left voice message ? ?  ?

## 2021-10-01 ENCOUNTER — Encounter: Payer: Self-pay | Admitting: Medical-Surgical

## 2021-10-02 NOTE — Telephone Encounter (Signed)
Patient has a HFU scheduled on 10/04/21. ?

## 2021-10-04 ENCOUNTER — Encounter: Payer: Self-pay | Admitting: Medical-Surgical

## 2021-10-04 ENCOUNTER — Ambulatory Visit: Payer: BC Managed Care – PPO | Admitting: Medical-Surgical

## 2021-10-04 VITALS — BP 116/72 | HR 74 | Resp 20 | Ht 65.0 in | Wt 127.2 lb

## 2021-10-04 DIAGNOSIS — R0602 Shortness of breath: Secondary | ICD-10-CM | POA: Diagnosis not present

## 2021-10-04 DIAGNOSIS — Z09 Encounter for follow-up examination after completed treatment for conditions other than malignant neoplasm: Secondary | ICD-10-CM | POA: Diagnosis not present

## 2021-10-04 DIAGNOSIS — R0789 Other chest pain: Secondary | ICD-10-CM | POA: Diagnosis not present

## 2021-10-04 NOTE — Progress Notes (Signed)
?  HPI with pertinent ROS:  ? ?CC: hospital discharge follow up ? ?HPI: ?Pleasant 66 year old female presenting today for a hospital discharge follow up. She was originally seen in the ED on 3/18 for atypical chest pain with shortness of breath. She was evaluated with no cause found and subsequently discharged home. She continued to have shortness of breath and chest discomfort and when she reported to work on 3/20, she was sent to ED again where the decision was made to admit her for further workup and observation. During her hospitalization, she was evaluated by Cardiology who determined her symptoms were not cardiac in nature. She was discharged on 3/22 with instructions to follow up with her PCP.  ? ?Today, she presents visibly uncomfortable. She is moving slowly because she feels that she gets short of breath with activity. She continues to feel the chest pressure but not as bad as it was. She is reassured that her heart is healthy but concerned because her symptoms are still present. Thinks that her symptoms are at least partly caused by anxiety. She is very worried about her health and has never experience anything like this before. Has lived a very healthy, active life so the recent events have been very bothersome. She is not interested in medication to help manage anxiety and notes that she is scared of medications due to her poor response to nitroglycerine (profound hypotension per patient report). Is supposed to go back to work on Monday but is so weak that she is having a hard time getting around in her house. Has been resting at home since her discharge. Feels that she may be weak because she was given so much medicine in the hospital and her body is trying to recover.  ? ?I reviewed the past medical history, family history, social history, surgical history, and allergies today and no changes were needed.  Please see the problem list section below in epic for further details. ? ? ?Physical exam:   ? ?General: Well Developed, well nourished, and in no acute distress.  ?Neuro: Alert and oriented x3.  ?HEENT: Normocephalic, atraumatic.  ?Skin: Warm and dry. ?Cardiac: Regular rate and rhythm, no murmurs rubs or gallops, no lower extremity edema.  ?Respiratory: Clear to auscultation bilaterally. Not using accessory muscles, speaking in full sentences. ? ?Impression and Recommendations:   ? ?1. Shortness of breath ?2. Chest pressure ?Rechecking CBC w/diff and BMP per discharge summary recommendations. Etiology remains unclear. Cardiology workup negative for cardiac cause. Consider anxiety versus a pulmonary issue. No previous lung issues. D-dimer negative, low suspicion for PE so no further evaluation done in the hospital. May need referral to pulmonology if symptoms persist. Offered medication/counseling to help with anxiety, patient declined. She does not feel comfortable returning to work next week and I would agree that she needs more time and evaluation. Advised patient to contact her HR department and have them send me any paperwork that needs to be completed. We will plan for another 2 weeks out of work for now. Advised to stay active as tolerated. Increase PO fluids and rest when needed. Reviewed strict return precautions for worsening symptoms.  ?- CBC with Differential ?- BASIC METABOLIC PANEL WITH GFR ? ?Return in about 2 weeks (around 10/18/2021) for SOB/CP follow up (ok to use same day if needed). ?___________________________________________ ?Clearnce Sorrel, DNP, APRN, FNP-BC ?Primary Care and Sports Medicine ?[facility] ?

## 2021-10-05 LAB — CBC WITH DIFFERENTIAL/PLATELET
Absolute Monocytes: 283 cells/uL (ref 200–950)
Basophils Absolute: 19 cells/uL (ref 0–200)
Basophils Relative: 0.4 %
Eosinophils Absolute: 91 cells/uL (ref 15–500)
Eosinophils Relative: 1.9 %
HCT: 40.8 % (ref 35.0–45.0)
Hemoglobin: 13.7 g/dL (ref 11.7–15.5)
Lymphs Abs: 1642 cells/uL (ref 850–3900)
MCH: 30.9 pg (ref 27.0–33.0)
MCHC: 33.6 g/dL (ref 32.0–36.0)
MCV: 92.1 fL (ref 80.0–100.0)
MPV: 9.8 fL (ref 7.5–12.5)
Monocytes Relative: 5.9 %
Neutro Abs: 2765 cells/uL (ref 1500–7800)
Neutrophils Relative %: 57.6 %
Platelets: 222 10*3/uL (ref 140–400)
RBC: 4.43 10*6/uL (ref 3.80–5.10)
RDW: 12.7 % (ref 11.0–15.0)
Total Lymphocyte: 34.2 %
WBC: 4.8 10*3/uL (ref 3.8–10.8)

## 2021-10-05 LAB — BASIC METABOLIC PANEL WITH GFR
BUN: 15 mg/dL (ref 7–25)
CO2: 32 mmol/L (ref 20–32)
Calcium: 9.7 mg/dL (ref 8.6–10.4)
Chloride: 103 mmol/L (ref 98–110)
Creat: 0.79 mg/dL (ref 0.50–1.05)
Glucose, Bld: 89 mg/dL (ref 65–99)
Potassium: 4.7 mmol/L (ref 3.5–5.3)
Sodium: 142 mmol/L (ref 135–146)
eGFR: 83 mL/min/{1.73_m2} (ref >=60)

## 2021-10-18 ENCOUNTER — Ambulatory Visit: Payer: BC Managed Care – PPO | Admitting: Medical-Surgical

## 2021-10-18 ENCOUNTER — Encounter: Payer: Self-pay | Admitting: Medical-Surgical

## 2021-10-18 VITALS — BP 110/71 | HR 70 | Resp 20 | Ht 65.0 in | Wt 129.1 lb

## 2021-10-18 DIAGNOSIS — R5383 Other fatigue: Secondary | ICD-10-CM | POA: Diagnosis not present

## 2021-10-18 DIAGNOSIS — R0602 Shortness of breath: Secondary | ICD-10-CM

## 2021-10-18 DIAGNOSIS — K219 Gastro-esophageal reflux disease without esophagitis: Secondary | ICD-10-CM

## 2021-10-18 DIAGNOSIS — R0789 Other chest pain: Secondary | ICD-10-CM | POA: Diagnosis not present

## 2021-10-18 DIAGNOSIS — R1010 Upper abdominal pain, unspecified: Secondary | ICD-10-CM | POA: Diagnosis not present

## 2021-10-18 MED ORDER — PANTOPRAZOLE SODIUM 40 MG PO TBEC: 40.0000 mg | DELAYED_RELEASE_TABLET | Freq: Every day | ORAL | 3 refills | Status: DC

## 2021-10-18 NOTE — Progress Notes (Signed)
?  HPI with pertinent ROS:  ? ?CC: Chest pressure/shortness of breath ? ?HPI: ?Pleasant 66 year old female presenting today for follow-up on chest pressure and shortness of breath. Over the past 2 weeks has been doing better. She has had a lot of improvements. Still occasionally has chest tightness but feels that it might be her mind playing tricks on her. Drove herself for the the first time yesterday and went to the grocery store for about an hour. She was very fatigued after that and is worried about being able to tolerate going back to work. Part time is not feasible with her job.  Feels that she needs to gradually increase her activity so that she will be able to tolerate full days at work. ? ?Has had some intermittent upper abdominal discomfort that she reports as uncomfortable rather than pain.  No burning or cramping.  She does have some intermittent acid reflux and is not currently on any medications. ? ?Reports that she is always cold no matter what the weather is and frequently has to wear multiple layers.  Last thyroid checked 03/2020, normal.  No recent iron studies. ? ?I reviewed the past medical history, family history, social history, surgical history, and allergies today and no changes were needed.  Please see the problem list section below in epic for further details. ? ? ?Physical exam:  ? ?General: Well Developed, well nourished, and in no acute distress.  ?Neuro: Alert and oriented x3.  ?HEENT: Normocephalic, atraumatic.  ?Skin: Warm and dry. ?Cardiac: Regular rate and rhythm, no murmurs rubs or gallops, no lower extremity edema.  ?Respiratory: Clear to auscultation bilaterally. Not using accessory muscles, speaking in full sentences. ? ?Impression and Recommendations:   ? ?1. Shortness of breath ?2. Chest pressure ?She has seen some improvement although she does still remain weak.  After discussion, we will go ahead and write her out for another 2 weeks and plan to follow-up at that time to see  if she is ready to go back to work.  Discussed increasing her activity slowly and gradually to return to previous activity levels.  Patient verbalized understanding is agreeable to the plan. ? ?3. Fatigue, unspecified type ?She does have significant fatigue and states cold all the time so we will go ahead and check iron studies and thyroid function. ?- Fe+TIBC+Fer ?- TSH ? ?4.  Abdominal pain/reflux ?Adding Protonix 40 mg daily. ? ?Return in about 2 weeks (around 11/01/2021) for abd pain/chest tightness/SOB follow up (ok to use same day). ?___________________________________________ ?Thayer Ohm, DNP, APRN, FNP-BC ?Primary Care and Sports Medicine ?[facility] ?

## 2021-10-19 LAB — IRON,TIBC AND FERRITIN PANEL
%SAT: 30 % (calc) (ref 16–45)
Ferritin: 108 ng/mL (ref 16–288)
Iron: 89 ug/dL (ref 45–160)
TIBC: 292 mcg/dL (calc) (ref 250–450)

## 2021-10-19 LAB — TSH: TSH: 0.45 mIU/L (ref 0.40–4.50)

## 2021-11-01 ENCOUNTER — Ambulatory Visit: Payer: BC Managed Care – PPO | Admitting: Medical-Surgical

## 2021-11-01 ENCOUNTER — Encounter: Payer: Self-pay | Admitting: Medical-Surgical

## 2021-11-01 VITALS — BP 98/62 | HR 69 | Resp 20 | Ht 65.0 in | Wt 130.6 lb

## 2021-11-01 DIAGNOSIS — R1012 Left upper quadrant pain: Secondary | ICD-10-CM

## 2021-11-01 DIAGNOSIS — R0789 Other chest pain: Secondary | ICD-10-CM

## 2021-11-01 DIAGNOSIS — R0602 Shortness of breath: Secondary | ICD-10-CM

## 2021-11-01 NOTE — Progress Notes (Signed)
?  HPI with pertinent ROS:  ? ?CC: SOB/chest pressure follow-up ? ?HPI: ?Pleasant 66 year old female presenting today for follow-up on chest pressure and shortness of breath.  She was last seen 2 weeks ago with reports that she had improved some but was still having a bit of difficulty getting her prior level of activity back. Today, she presents with reports of feeling much better and has a form requesting clearance for return to work. Notes that since she started the Protonix 40mg  daily, the burning and epigastric discomfort she was experiencing has improved. Her chest tightness and shortness of breath have resolved. She has been working to stay more active and feels that she is back to 80% or so of her normal. Does still report that she has intermittent LUQ abdominal discomfort, especially after eating and has to sit up very straight to get relief of this. No issues with constipation or diarrhea. Symptoms do not seem related to particular activities, foods, or times of day. No fevers, chills, or other associated symptoms.  ? ?I reviewed the past medical history, family history, social history, surgical history, and allergies today and no changes were needed.  Please see the problem list section below in epic for further details. ? ? ?Physical exam:  ? ?General: Well Developed, well nourished, and in no acute distress.  ?Neuro: Alert and oriented x3.  ?HEENT: Normocephalic, atraumatic.  ?Skin: Warm and dry. ?Cardiac: Regular rate and rhythm, no murmurs rubs or gallops, no lower extremity edema.  ?Respiratory: Clear to auscultation bilaterally. Not using accessory muscles, speaking in full sentences. ? ?Impression and Recommendations:   ? ?1. Shortness of breath ?2. Chest pressure ?Mostly resolved. Doing much better. Form completed at patient's request to release her back to work.  ? ?3. LUQ abdominal pain ?Unclear etiology. With intermittent nature and association with meals/position, concern for a possible hiatal  hernia. Ordering CT abdomen/pelvis w/o contrast for further evaluation.  ? ?Return if symptoms worsen or fail to improve. ?___________________________________________ ? , DNP, APRN, FNP-BC ?Primary Care and Sports Medicine ?[facility]m ?

## 2021-11-08 ENCOUNTER — Ambulatory Visit (INDEPENDENT_AMBULATORY_CARE_PROVIDER_SITE_OTHER): Payer: BC Managed Care – PPO

## 2021-11-08 DIAGNOSIS — R1012 Left upper quadrant pain: Secondary | ICD-10-CM

## 2021-11-13 ENCOUNTER — Encounter: Payer: Self-pay | Admitting: Medical-Surgical

## 2021-11-14 MED ORDER — PANTOPRAZOLE SODIUM 20 MG PO TBEC: 20.0000 mg | DELAYED_RELEASE_TABLET | Freq: Every day | ORAL | 1 refills | Status: DC

## 2021-11-19 ENCOUNTER — Telehealth: Payer: Self-pay

## 2021-11-19 NOTE — Telephone Encounter (Signed)
Steward Drone from [employer] called stating patient had labs drawn on 01/19/2001 and insurance is not covering labs. Steward Drone stated labs was not billed under preventive and would like to know if Brittany Travis can change the billing code to preventive. Steward Drone can be reached at [phone].   ?

## 2021-11-25 ENCOUNTER — Other Ambulatory Visit: Payer: Self-pay | Admitting: Medical-Surgical

## 2021-11-25 MED ORDER — TRIAMCINOLONE ACETONIDE 0.1 % EX CREA: 1.0000 "application " | TOPICAL_CREAM | Freq: Two times a day (BID) | CUTANEOUS | 0 refills | Status: DC

## 2021-11-28 NOTE — Telephone Encounter (Signed)
Added diagnosis with [facility]. It will take up to 6 weeks to process.

## 2022-04-11 ENCOUNTER — Encounter: Payer: Self-pay | Admitting: Medical-Surgical

## 2022-05-20 ENCOUNTER — Telehealth: Payer: Self-pay

## 2022-05-20 NOTE — Telephone Encounter (Signed)
Patient called with a billing problem from [facility].  She is receiving a bill from [facility] for a lipid panel that was done on 03/22/2021.  The Dx code needs to be changed.

## 2022-06-05 NOTE — Telephone Encounter (Signed)
[facility] has adjusted the bill and will send a new statement to the patient. Left message advising patient.

## 2023-05-20 ENCOUNTER — Encounter: Payer: Self-pay | Admitting: Medical-Surgical

## 2023-05-20 ENCOUNTER — Ambulatory Visit (INDEPENDENT_AMBULATORY_CARE_PROVIDER_SITE_OTHER): Payer: BC Managed Care – PPO | Admitting: Medical-Surgical

## 2023-05-20 VITALS — BP 96/62 | HR 63 | Resp 20 | Ht 65.0 in | Wt 134.7 lb

## 2023-05-20 DIAGNOSIS — Z23 Encounter for immunization: Secondary | ICD-10-CM | POA: Diagnosis not present

## 2023-05-20 DIAGNOSIS — Z0000 Encounter for general adult medical examination without abnormal findings: Secondary | ICD-10-CM

## 2023-05-20 DIAGNOSIS — Z1231 Encounter for screening mammogram for malignant neoplasm of breast: Secondary | ICD-10-CM | POA: Diagnosis not present

## 2023-05-20 DIAGNOSIS — I700 Atherosclerosis of aorta: Secondary | ICD-10-CM

## 2023-05-20 NOTE — Progress Notes (Signed)
Complete physical exam  Patient: Brittany Travis   DOB: March 13, 1956   67 y.o. Female  MRN: [id number]  Subjective:    Chief Complaint  Patient presents with   Annual Exam    Jarrod Lamoreaux is a 67 y.o. female who presents today for a complete physical exam. She reports consuming a general diet.  Swims once weekly and walks on her treadmill daily.  She generally feels well. She reports sleeping well. She does not have additional problems to discuss today.    Most recent fall risk assessment:    05/20/2023    9:38 AM  Fall Risk   Falls in the past year? 0  Number falls in past yr: 0  Injury with Fall? 0  Risk for fall due to : No Fall Risks  Follow up Falls evaluation completed     Most recent depression screenings:    05/20/2023    9:38 AM 11/01/2021    4:27 PM  PHQ 2/9 Scores  PHQ - 2 Score 0 0    Vision:Not within last year  and Dental: No current dental problems and Receives regular dental care    Patient Care Team: Christen Butter, NP as PCP - General (Nurse Practitioner)   Outpatient Medications Prior to Visit  Medication Sig   loratadine (CLARITIN) 10 MG tablet Take 10 mg by mouth daily as needed for allergies.   Multiple Vitamin (MULTIVITAMIN) tablet Take 1 tablet by mouth daily.   naphazoline-pheniramine (NAPHCON-A) 0.025-0.3 % ophthalmic solution Place 1 drop into both eyes 4 (four) times daily as needed for eye irritation or allergies.   vitamin C (ASCORBIC ACID) 500 MG tablet Take 500 mg by mouth daily.   [DISCONTINUED] triamcinolone cream (KENALOG) 0.1 % Apply 1 application. topically 2 (two) times daily.   No facility-administered medications prior to visit.    Review of Systems  Constitutional:  Negative for chills, fever, malaise/fatigue and weight loss.  HENT:  Negative for congestion, ear pain, hearing loss, sinus pain and sore throat.   Eyes:  Negative for blurred vision, photophobia and pain.  Respiratory:  Negative for cough, shortness of breath and wheezing.    Cardiovascular:  Negative for chest pain, palpitations and leg swelling.  Gastrointestinal:  Negative for abdominal pain, constipation, diarrhea, heartburn, nausea and vomiting.  Genitourinary:  Negative for dysuria, frequency and urgency.  Musculoskeletal:  Negative for falls and neck pain.  Skin:  Negative for itching and rash.  Neurological:  Negative for dizziness, weakness and headaches.  Endo/Heme/Allergies:  Negative for polydipsia. Does not bruise/bleed easily.  Psychiatric/Behavioral:  Negative for depression, substance abuse and suicidal ideas. The patient is not nervous/anxious and does not have insomnia.      Objective:    BP 96/62 (BP Location: Left Arm, Cuff Size: Normal)   Pulse 63   Resp 20   Ht 5\' 5"  (1.651 m)   Wt 134 lb 11.2 oz (61.1 kg)   SpO2 100%   BMI 22.42 kg/m    Physical Exam Vitals reviewed.  Constitutional:      General: She is not in acute distress.    Appearance: Normal appearance. She is normal weight. She is not ill-appearing.  HENT:     Head: Normocephalic and atraumatic.     Right Ear: Tympanic membrane, ear canal and external ear normal. There is no impacted cerumen.     Left Ear: Tympanic membrane, ear canal and external ear normal. There is no impacted cerumen.     Nose: Nose  normal. No congestion or rhinorrhea.     Mouth/Throat:     Mouth: Mucous membranes are moist.     Pharynx: No oropharyngeal exudate or posterior oropharyngeal erythema.  Eyes:     General: No scleral icterus.       Right eye: No discharge.        Left eye: No discharge.     Extraocular Movements: Extraocular movements intact.     Conjunctiva/sclera: Conjunctivae normal.     Pupils: Pupils are equal, round, and reactive to light.  Neck:     Thyroid: No thyromegaly.     Vascular: No carotid bruit or JVD.     Trachea: Trachea normal.  Cardiovascular:     Rate and Rhythm: Normal rate and regular rhythm.     Pulses: Normal pulses.     Heart sounds: Normal heart  sounds. No murmur heard.    No friction rub. No gallop.  Pulmonary:     Effort: Pulmonary effort is normal. No respiratory distress.     Breath sounds: Normal breath sounds. No wheezing.  Abdominal:     General: Bowel sounds are normal. There is no distension.     Palpations: Abdomen is soft.     Tenderness: There is no abdominal tenderness. There is no guarding.  Musculoskeletal:        General: Normal range of motion.     Cervical back: Normal range of motion and neck supple.  Lymphadenopathy:     Cervical: No cervical adenopathy.  Skin:    General: Skin is warm and dry.  Neurological:     Mental Status: She is alert and oriented to person, place, and time.     Cranial Nerves: No cranial nerve deficit.  Psychiatric:        Mood and Affect: Mood normal.        Behavior: Behavior normal.        Thought Content: Thought content normal.        Judgment: Judgment normal.      No results found for any visits on 05/20/23.     Assessment & Plan:    Routine Health Maintenance and Physical Exam  Immunization History  Administered Date(s) Administered   Influenza-Unspecified 04/02/2022, 04/19/2023   PFIZER(Purple Top)SARS-COV-2 Vaccination 09/21/2019, 10/19/2019, 05/16/2020   Pfizer Covid-19 Vaccine Bivalent Booster 56yrs & up 04/02/2022   Tdap 03/08/2020   Zoster Recombinant(Shingrix) 03/22/2021, 08/21/2021    Health Maintenance  Topic Date Due   Pneumonia Vaccine 80+ Years old (1 of 1 - PCV) Never done   MAMMOGRAM  05/16/2023   COVID-19 Vaccine (5 - 2023-24 season) 06/05/2023 (Originally 03/08/2023)   DTaP/Tdap/Td (2 - Td or Tdap) 03/08/2030   Colonoscopy  05/10/2030   INFLUENZA VACCINE  Completed   DEXA SCAN  Completed   Hepatitis C Screening  Completed   Zoster Vaccines- Shingrix  Completed   HPV VACCINES  Aged Out    Discussed health benefits of physical activity, and encouraged her to engage in regular exercise appropriate for her age and condition.  1. Annual  physical exam Checking labs as noted below.  Up-to-date on preventative care.  Wellness information provided with AVS. - CBC with Differential/Platelet - CMP14+EGFR  2. Aortic atherosclerosis (HCC) Checking lipids today.  Reviewed recommendation for statin medication but she would like to hold off until she sees what her lipid panel looks like. - Lipid panel  3. Breast cancer screening by mammogram Mammogram ordered. - MM DIGITAL SCREENING BILATERAL; Future  4.  Need for pneumococcal vaccination Discussed recommendations for pneumococcal vaccination started the age of 29.  She has not had a vaccine yet and after review of recommendations, is amenable to receiving this.  Prevnar 20 given in office today.  Plan for pneumococcal vaccination next year then every 5 years thereafter.  Return in about 1 year (around 05/19/2024) for annual physical exam or sooner if needed.     Christen Butter, NP

## 2023-05-20 NOTE — Addendum Note (Signed)
Addended by: Latanya Presser on: 05/20/2023 10:28 AM   Modules accepted: Orders

## 2023-05-21 LAB — CBC WITH DIFFERENTIAL/PLATELET
Basophils Absolute: 0 10*3/uL (ref 0.0–0.2)
Basos: 1 %
EOS (ABSOLUTE): 0.1 10*3/uL (ref 0.0–0.4)
Eos: 3 %
Hematocrit: 39.5 % (ref 34.0–46.6)
Hemoglobin: 12.7 g/dL (ref 11.1–15.9)
Immature Grans (Abs): 0 10*3/uL (ref 0.0–0.1)
Immature Granulocytes: 0 %
Lymphocytes Absolute: 1.6 10*3/uL (ref 0.7–3.1)
Lymphs: 39 %
MCH: 30.3 pg (ref 26.6–33.0)
MCHC: 32.2 g/dL (ref 31.5–35.7)
MCV: 94 fL (ref 79–97)
Monocytes Absolute: 0.3 10*3/uL (ref 0.1–0.9)
Monocytes: 7 %
Neutrophils Absolute: 2 10*3/uL (ref 1.4–7.0)
Neutrophils: 50 %
Platelets: 186 10*3/uL (ref 150–450)
RBC: 4.19 x10E6/uL (ref 3.77–5.28)
RDW: 13.3 % (ref 11.7–15.4)
WBC: 4 10*3/uL (ref 3.4–10.8)

## 2023-05-21 LAB — CMP14+EGFR
ALT: 14 [IU]/L (ref 0–32)
AST: 23 [IU]/L (ref 0–40)
Albumin: 4.1 g/dL (ref 3.9–4.9)
Alkaline Phosphatase: 70 [IU]/L (ref 44–121)
BUN/Creatinine Ratio: 25 (ref 12–28)
BUN: 20 mg/dL (ref 8–27)
Bilirubin Total: 0.3 mg/dL (ref 0.0–1.2)
CO2: 26 mmol/L (ref 20–29)
Calcium: 9.2 mg/dL (ref 8.7–10.3)
Chloride: 105 mmol/L (ref 96–106)
Creatinine, Ser: 0.81 mg/dL (ref 0.57–1.00)
Globulin, Total: 2.5 g/dL (ref 1.5–4.5)
Glucose: 92 mg/dL (ref 70–99)
Potassium: 4.6 mmol/L (ref 3.5–5.2)
Sodium: 144 mmol/L (ref 134–144)
Total Protein: 6.6 g/dL (ref 6.0–8.5)
eGFR: 80 mL/min/{1.73_m2} (ref >59)

## 2023-05-21 LAB — LIPID PANEL
Chol/HDL Ratio: 3.1 ratio (ref 0.0–4.4)
Cholesterol, Total: 190 mg/dL (ref 100–199)
HDL: 62 mg/dL (ref >39)
LDL Chol Calc (NIH): 114 mg/dL — ABNORMAL HIGH (ref 0–99)
Triglycerides: 79 mg/dL (ref 0–149)
VLDL Cholesterol Cal: 14 mg/dL (ref 5–40)

## 2023-07-15 ENCOUNTER — Ambulatory Visit: Payer: BC Managed Care – PPO

## 2023-07-15 DIAGNOSIS — Z1231 Encounter for screening mammogram for malignant neoplasm of breast: Secondary | ICD-10-CM

## 2023-07-28 IMAGING — CT CT ABD-PELV W/O CM
2 of 4 series · 15 of 46 positions shown, 17 images · non-contrast
Comparison: None Available.

CLINICAL DATA: Left upper quadrant pain.



[Series 2: axial st · axial · 0.57mm/px · z∈[-471,-91]mm · 12 of 87 slices shown, 14 images]
[im 7/87  soft-tissue]
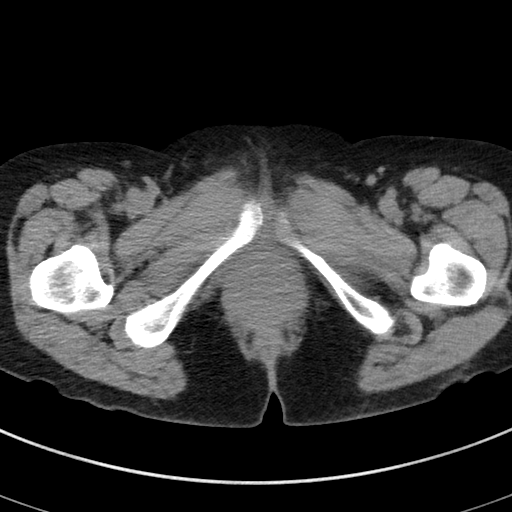
[im 7/87  bone]
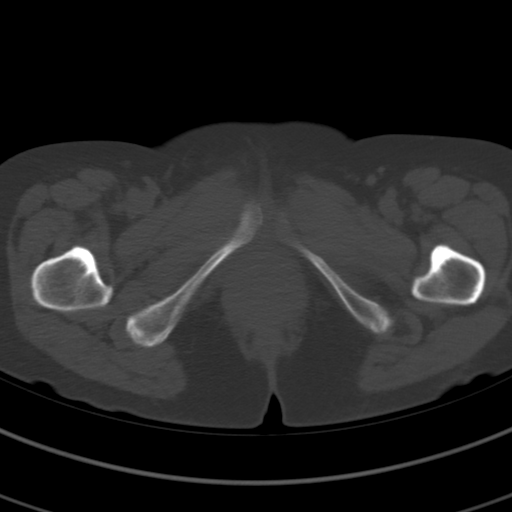
[im 14/87  soft-tissue]
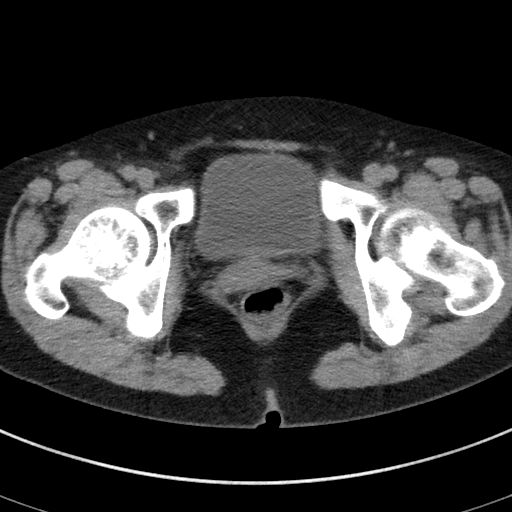
[im 21/87  soft-tissue]
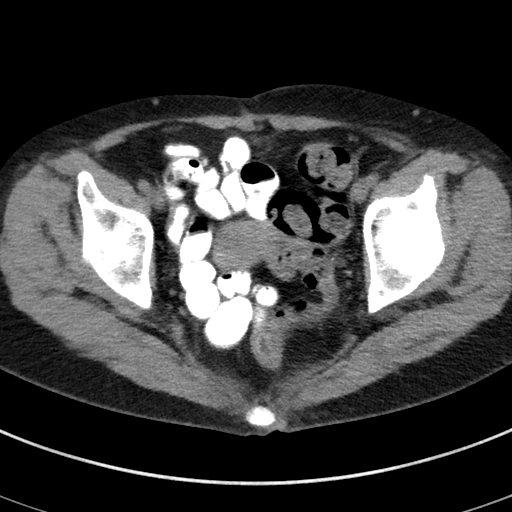
[im 28/87  soft-tissue]
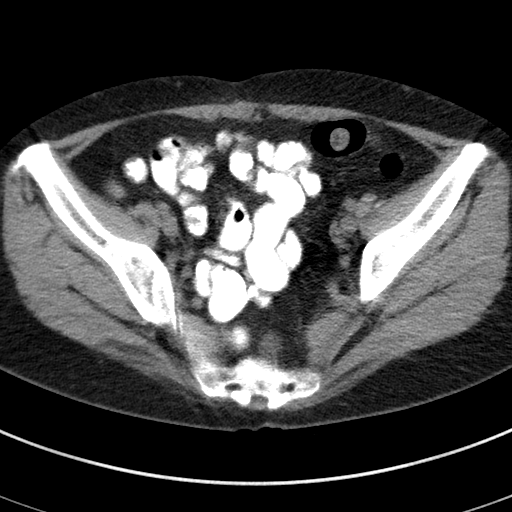
[im 35/87  soft-tissue]
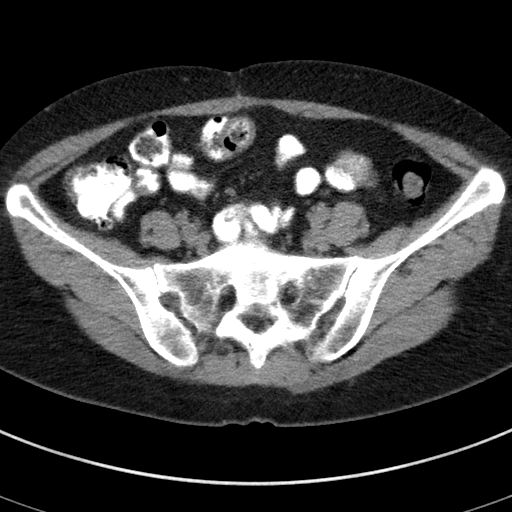
[im 42/87  soft-tissue]
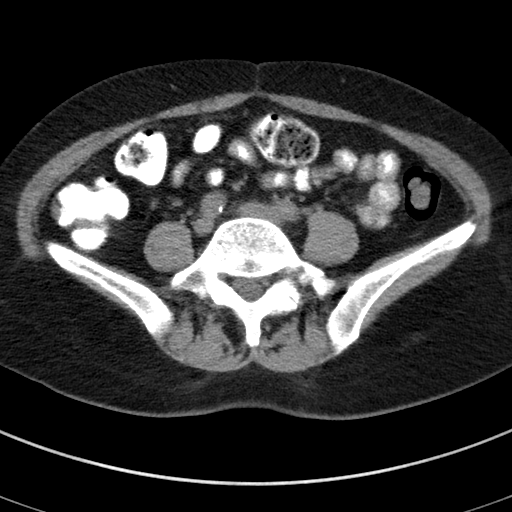
[im 49/87  soft-tissue]
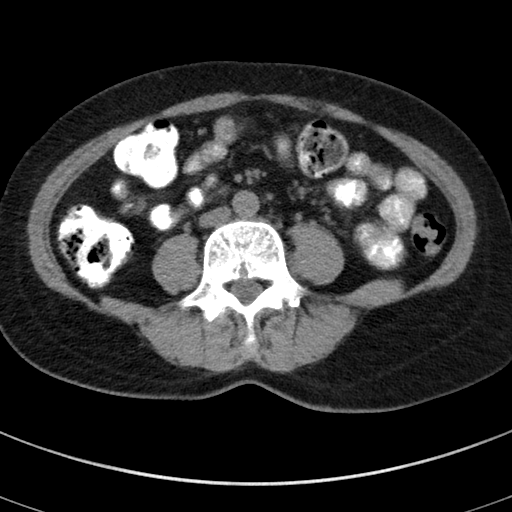
[im 56/87  soft-tissue]
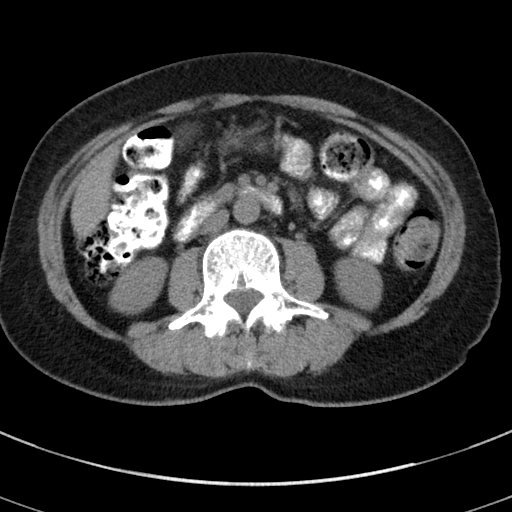
[im 62/87  soft-tissue]
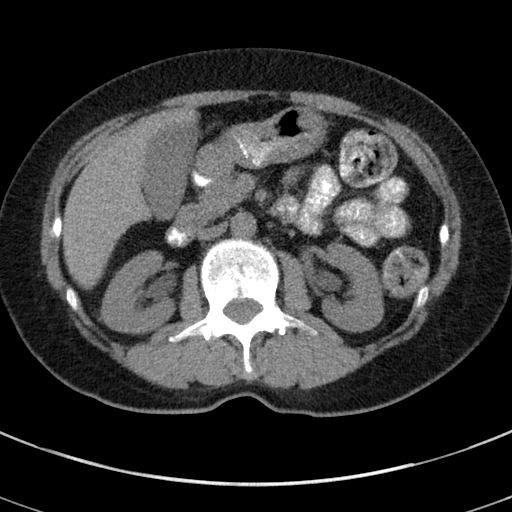
[im 62/87  bone]
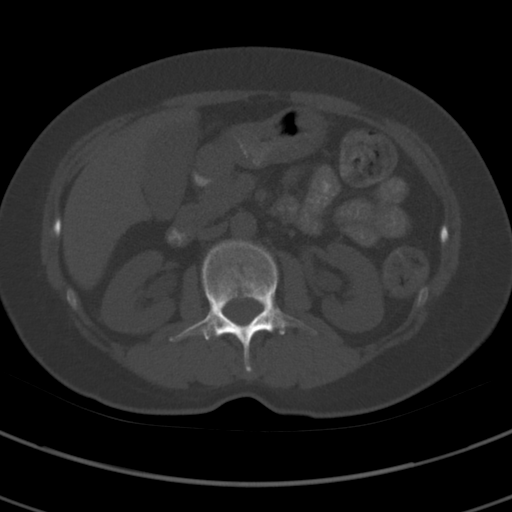
[im 69/87  soft-tissue]
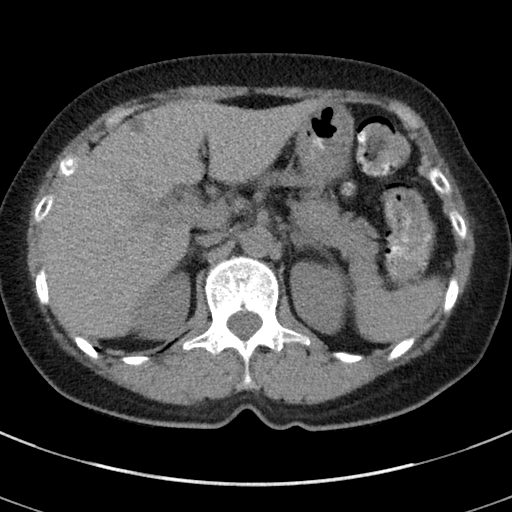
[im 76/87  soft-tissue]
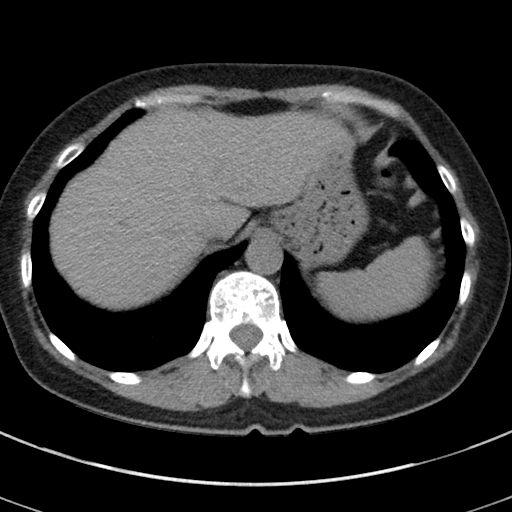
[im 83/87  soft-tissue]
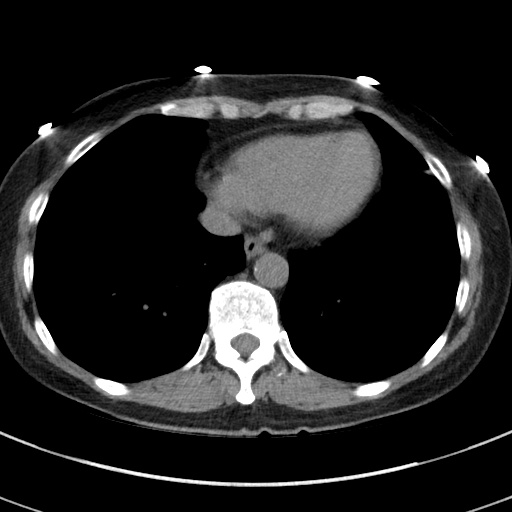

[Series 5: coronal st · coronal · 0.55mm/px · 3 of 61 slices shown]
[im 21/61  soft-tissue]
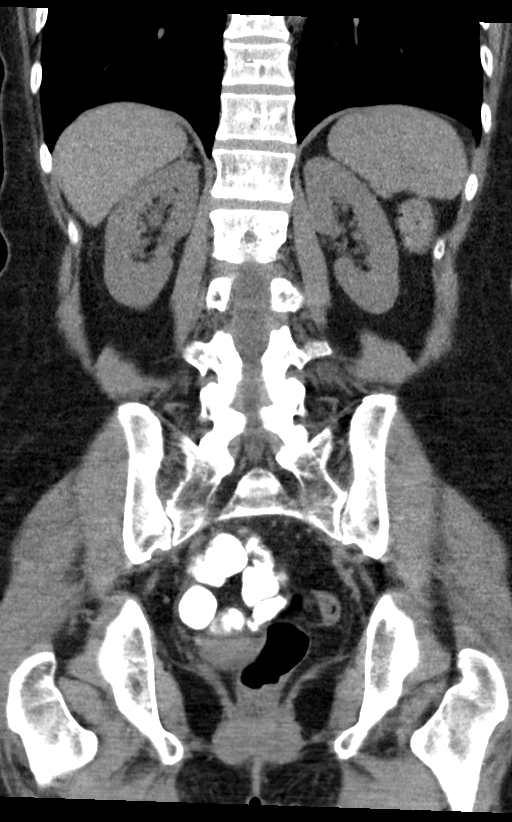
[im 27/61  soft-tissue]
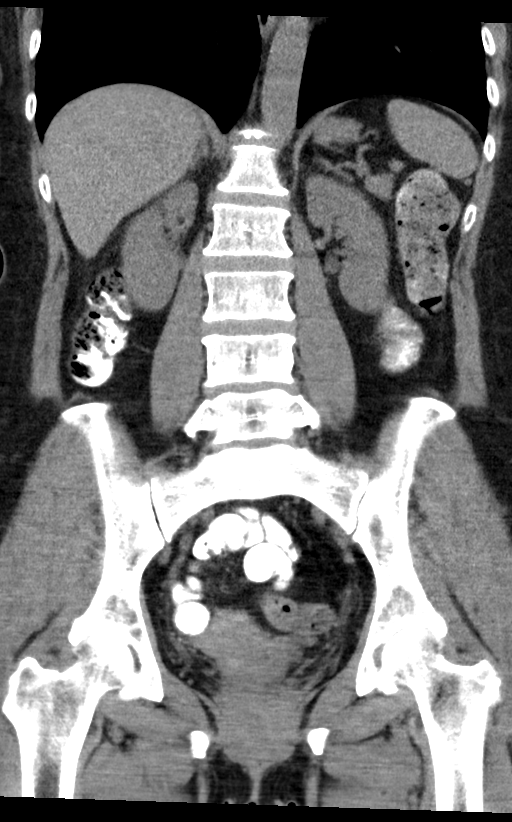
[im 34/61  soft-tissue]
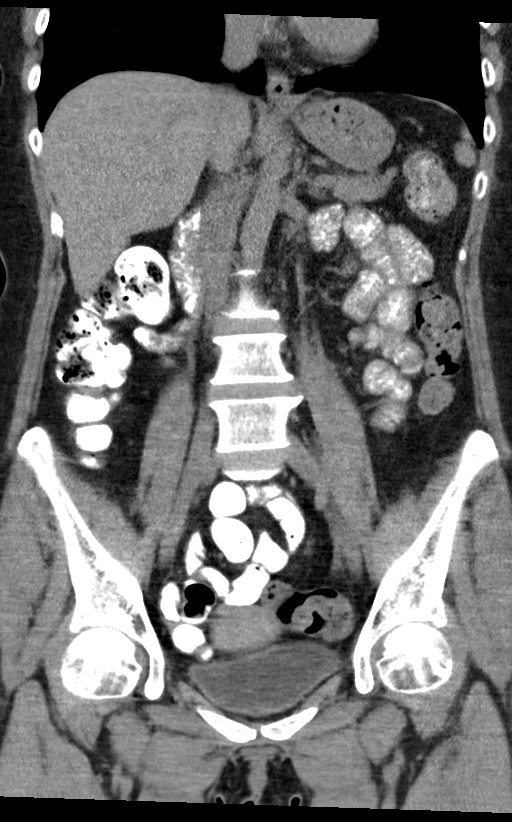

[15 of 46 positions shown; findings below may reference images not displayed]

FINDINGS: Lower chest: No acute abnormality.

Hepatobiliary: Rounded hypodensity in the left lobe of the liver is
too small to characterize measuring 8 mm, there are 2 rounded
hypodensities measuring 8 mm in the left lobe of the liver which are
too small to characterize. Gallbladder and bile ducts are within
normal limits.

Pancreas: Unremarkable. No pancreatic ductal dilatation or
surrounding inflammatory changes.

Spleen: Normal in size without focal abnormality.

Adrenals/Urinary Tract: Adrenal glands are unremarkable. Kidneys are
normal, without renal calculi, focal lesion, or hydronephrosis.
Bladder is unremarkable.

Stomach/Bowel: Stomach is within normal limits. No evidence of bowel
wall thickening, distention, or inflammatory changes. Appendix is
not seen.

Vascular/Lymphatic: No significant vascular findings are present. No
enlarged abdominal or pelvic lymph nodes.

Reproductive: Uterus and bilateral adnexa are unremarkable.

Other: No abdominal wall hernia or abnormality. No abdominopelvic
ascites.

Musculoskeletal: No acute or significant osseous findings.
IMPRESSION: 1. No acute localizing process in the abdomen or pelvis.
2. There are 2 rounded hypodensities in the left lobe of the liver
which are too small to characterize, possibly cysts or hemangiomas.
These can be further evaluated with ultrasound or MRI if clinically
warranted.
